# Patient Record
Sex: Female | Born: 1950 | Race: Black or African American | Hispanic: No | Marital: Married | Smoking: Former smoker
Health system: Southern US, Community
[De-identification: ages and names within clinical notes are randomized; demographics above are authoritative.]

## PROBLEM LIST (undated history)

## (undated) DIAGNOSIS — K219 Gastro-esophageal reflux disease without esophagitis: Secondary | ICD-10-CM

## (undated) DIAGNOSIS — G8929 Other chronic pain: Secondary | ICD-10-CM

## (undated) DIAGNOSIS — I10 Essential (primary) hypertension: Secondary | ICD-10-CM

## (undated) DIAGNOSIS — J449 Chronic obstructive pulmonary disease, unspecified: Secondary | ICD-10-CM

## (undated) DIAGNOSIS — E119 Type 2 diabetes mellitus without complications: Secondary | ICD-10-CM

## (undated) DIAGNOSIS — M545 Low back pain, unspecified: Secondary | ICD-10-CM

## (undated) DIAGNOSIS — I2510 Atherosclerotic heart disease of native coronary artery without angina pectoris: Secondary | ICD-10-CM

## (undated) DIAGNOSIS — E669 Obesity, unspecified: Secondary | ICD-10-CM

## (undated) DIAGNOSIS — M1990 Unspecified osteoarthritis, unspecified site: Secondary | ICD-10-CM

## (undated) DIAGNOSIS — Z860100 Personal history of colon polyps, unspecified: Secondary | ICD-10-CM

## (undated) DIAGNOSIS — G4730 Sleep apnea, unspecified: Secondary | ICD-10-CM

## (undated) DIAGNOSIS — E785 Hyperlipidemia, unspecified: Secondary | ICD-10-CM

## (undated) DIAGNOSIS — M797 Fibromyalgia: Secondary | ICD-10-CM

## (undated) DIAGNOSIS — Z800 Family history of malignant neoplasm of digestive organs: Secondary | ICD-10-CM

## (undated) HISTORY — PX: ARTHRODESIS: SHX136

## (undated) HISTORY — DX: Type 2 diabetes mellitus without complications: E11.9

## (undated) HISTORY — DX: Obesity, unspecified: E66.9

## (undated) HISTORY — DX: Unspecified osteoarthritis, unspecified site: M19.90

## (undated) HISTORY — DX: Atherosclerotic heart disease of native coronary artery without angina pectoris: I25.10

## (undated) HISTORY — DX: Chronic obstructive pulmonary disease, unspecified: J44.9

## (undated) HISTORY — DX: Family history of malignant neoplasm of digestive organs: Z80.0

## (undated) HISTORY — DX: Low back pain, unspecified: M54.50

## (undated) HISTORY — PX: HYSTEROSCOPY: SHX211

## (undated) HISTORY — DX: Fibromyalgia: M79.7

## (undated) HISTORY — DX: Gastro-esophageal reflux disease without esophagitis: K21.9

## (undated) HISTORY — PX: CHOLECYSTECTOMY, LAPAROSCOPIC: SHX56

## (undated) HISTORY — DX: Essential (primary) hypertension: I10

## (undated) HISTORY — DX: Personal history of colon polyps, unspecified: Z86.0100

## (undated) HISTORY — DX: Other chronic pain: G89.29

## (undated) HISTORY — PX: REPAIR NONUNION / MALUNION METATARSAL / TARSAL BONES: SUR1194

## (undated) HISTORY — PX: SHOULDER ARTHROSCOPY: SHX128

## (undated) HISTORY — PX: CORONARY ANGIOPLASTY: SHX604

## (undated) HISTORY — PX: CARDIAC CATHETERIZATION: SHX172

## (undated) HISTORY — PX: REPLACEMENT TOTAL KNEE: SUR1224

## (undated) HISTORY — DX: Hyperlipidemia, unspecified: E78.5

## (undated) HISTORY — DX: Personal history of colonic polyps: Z86.010

## (undated) HISTORY — PX: CHOLECYSTECTOMY: SHX55

## (undated) HISTORY — DX: Sleep apnea, unspecified: G47.30

---
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
## 2010-11-28 HISTORY — PX: HERNIA REPAIR: SHX51

## 2013-07-17 HISTORY — PX: ESOPHAGOGASTRODUODENOSCOPY: SHX1529

## 2014-09-17 HISTORY — PX: COLONOSCOPY: SHX174

## 2014-11-28 HISTORY — PX: REPLACEMENT TOTAL KNEE: SUR1224

## 2015-11-29 HISTORY — PX: FOOT SURGERY: SHX648

## 2016-02-01 DIAGNOSIS — H8110 Benign paroxysmal vertigo, unspecified ear: Secondary | ICD-10-CM | POA: Insufficient documentation

## 2016-02-01 DIAGNOSIS — I2510 Atherosclerotic heart disease of native coronary artery without angina pectoris: Secondary | ICD-10-CM

## 2016-02-01 DIAGNOSIS — E785 Hyperlipidemia, unspecified: Secondary | ICD-10-CM | POA: Insufficient documentation

## 2016-02-01 DIAGNOSIS — M159 Polyosteoarthritis, unspecified: Secondary | ICD-10-CM | POA: Insufficient documentation

## 2016-02-01 DIAGNOSIS — J449 Chronic obstructive pulmonary disease, unspecified: Secondary | ICD-10-CM | POA: Insufficient documentation

## 2016-02-01 DIAGNOSIS — K219 Gastro-esophageal reflux disease without esophagitis: Secondary | ICD-10-CM | POA: Insufficient documentation

## 2016-02-01 DIAGNOSIS — R7301 Impaired fasting glucose: Secondary | ICD-10-CM | POA: Insufficient documentation

## 2016-02-01 DIAGNOSIS — J309 Allergic rhinitis, unspecified: Secondary | ICD-10-CM | POA: Insufficient documentation

## 2016-02-01 DIAGNOSIS — I10 Essential (primary) hypertension: Secondary | ICD-10-CM | POA: Insufficient documentation

## 2016-02-01 HISTORY — DX: Gastro-esophageal reflux disease without esophagitis: K21.9

## 2016-02-01 HISTORY — DX: Hyperlipidemia, unspecified: E78.5

## 2016-02-01 HISTORY — DX: Morbid (severe) obesity due to excess calories: E66.01

## 2016-02-01 HISTORY — DX: Benign paroxysmal vertigo, unspecified ear: H81.10

## 2016-02-01 HISTORY — DX: Atherosclerotic heart disease of native coronary artery without angina pectoris: I25.10

## 2016-02-03 DIAGNOSIS — M542 Cervicalgia: Secondary | ICD-10-CM | POA: Insufficient documentation

## 2016-02-03 DIAGNOSIS — R202 Paresthesia of skin: Secondary | ICD-10-CM | POA: Insufficient documentation

## 2016-04-04 DIAGNOSIS — S92252K Displaced fracture of navicular [scaphoid] of left foot, subsequent encounter for fracture with nonunion: Secondary | ICD-10-CM | POA: Insufficient documentation

## 2016-04-14 DIAGNOSIS — M19079 Primary osteoarthritis, unspecified ankle and foot: Secondary | ICD-10-CM | POA: Insufficient documentation

## 2016-04-14 DIAGNOSIS — M19172 Post-traumatic osteoarthritis, left ankle and foot: Secondary | ICD-10-CM | POA: Insufficient documentation

## 2017-08-23 DIAGNOSIS — R748 Abnormal levels of other serum enzymes: Secondary | ICD-10-CM

## 2017-08-23 DIAGNOSIS — F1120 Opioid dependence, uncomplicated: Secondary | ICD-10-CM | POA: Insufficient documentation

## 2017-08-23 DIAGNOSIS — R600 Localized edema: Secondary | ICD-10-CM

## 2017-08-23 DIAGNOSIS — E7800 Pure hypercholesterolemia, unspecified: Secondary | ICD-10-CM

## 2017-08-23 DIAGNOSIS — E876 Hypokalemia: Secondary | ICD-10-CM

## 2017-08-23 DIAGNOSIS — E119 Type 2 diabetes mellitus without complications: Secondary | ICD-10-CM

## 2017-08-23 HISTORY — DX: Localized edema: R60.0

## 2017-08-23 HISTORY — DX: Type 2 diabetes mellitus without complications: E11.9

## 2017-08-23 HISTORY — DX: Hypokalemia: E87.6

## 2017-08-23 HISTORY — DX: Opioid dependence, uncomplicated: F11.20

## 2017-08-23 HISTORY — DX: Abnormal levels of other serum enzymes: R74.8

## 2017-08-23 HISTORY — DX: Pure hypercholesterolemia, unspecified: E78.00

## 2017-09-26 DIAGNOSIS — Z9889 Other specified postprocedural states: Secondary | ICD-10-CM | POA: Insufficient documentation

## 2018-04-10 DIAGNOSIS — J22 Unspecified acute lower respiratory infection: Secondary | ICD-10-CM | POA: Insufficient documentation

## 2018-05-02 ENCOUNTER — Ambulatory Visit: Payer: Medicare Other | Admitting: Cardiology

## 2018-05-15 ENCOUNTER — Ambulatory Visit: Payer: Medicare Other | Admitting: Cardiology

## 2018-05-24 ENCOUNTER — Encounter: Payer: Self-pay | Admitting: Cardiology

## 2018-05-24 ENCOUNTER — Ambulatory Visit (INDEPENDENT_AMBULATORY_CARE_PROVIDER_SITE_OTHER): Payer: Medicare Other | Admitting: Cardiology

## 2018-05-24 VITALS — BP 128/76 | HR 85 | Ht 66.0 in | Wt 289.4 lb

## 2018-05-24 DIAGNOSIS — R0683 Snoring: Secondary | ICD-10-CM

## 2018-05-24 DIAGNOSIS — E119 Type 2 diabetes mellitus without complications: Secondary | ICD-10-CM | POA: Diagnosis not present

## 2018-05-24 DIAGNOSIS — Z9189 Other specified personal risk factors, not elsewhere classified: Secondary | ICD-10-CM

## 2018-05-24 DIAGNOSIS — I2510 Atherosclerotic heart disease of native coronary artery without angina pectoris: Secondary | ICD-10-CM | POA: Diagnosis not present

## 2018-05-24 DIAGNOSIS — I10 Essential (primary) hypertension: Secondary | ICD-10-CM

## 2018-05-24 DIAGNOSIS — E7800 Pure hypercholesterolemia, unspecified: Secondary | ICD-10-CM | POA: Diagnosis not present

## 2018-05-24 NOTE — Progress Notes (Signed)
Cardiology Consultation:    Date:  05/24/2018   ID:  Bedelia Person, DOB 12/29/1950, MRN [id number]  PCP:  Leane Call, PA-C  Cardiologist:  Gypsy Balsam, MD   Referring MD: Leane Call, PA-C   No chief complaint on file. I would like to be followed  History of Present Illness:    Kristina Singleton is a 67 y.o. female who is being seen today for the evaluation of swelling of lower extremities at the request of Nodal, Joline Salt, PA-C.  Apparently I saw her many years ago he 2003 she had cardiac catheterization and stenting of unknown artery which was done in [place].  Since that time there were no major heart issues.  I did not have my old records when I saw her many years ago.  She does have hypertension dyslipidemia she is morbidly obese does have chronic pain syndrome that is treated by [facility].  She described to have swelling of lower extremities however swelling is typically worse in the morning rather than evening time.  Recently she had left foot surgery and think that likely is been swollen since then.  This is more than year ago.  Her ability to exercise is fair she says she can climb a flight of stairs however when she walks with her husband she need to slow down.  She does snore a lot.  And her husband who does have sleep apnea tells me that she does have sleep apnea.  Past Medical History:  Diagnosis Date  . Diabetes mellitus without complication (HCC)   . Hyperlipidemia     Past Surgical History:  Procedure Laterality Date  . ARTHRODESIS    . CARDIAC CATHETERIZATION    . CESAREAN SECTION    . CHOLECYSTECTOMY, LAPAROSCOPIC    . CORONARY ANGIOPLASTY    . 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 REPLACEMENT TOTAL KNEE    . SHOULDER ARTHROSCOPY      Current Medications: Current Meds  Medication Sig  . albuterol (PROAIR HFA) 108 (90 Base) MCG/ACT inhaler Inhale 2 puffs into the lungs every 6 (six) hours as needed.  .  cetirizine (ZYRTEC) 10 MG tablet Take 1 mg by mouth daily.  . cloNIDine (CATAPRES) 0.3 MG tablet Take 1 tablet by mouth daily.  . clopidogrel (PLAVIX) 75 MG tablet Take 1 tablet by mouth daily.  . cyclobenzaprine (FLEXERIL) 10 MG tablet Take 1 tablet by mouth 2 (two) times daily.   Marland Kitchen docusate sodium (COLACE) 100 MG capsule Take 1 capsule by mouth daily as needed.   Marland Kitchen esomeprazole (NEXIUM) 40 MG capsule Take 1 capsule by mouth 2 (two) times daily.  Marland Kitchen ezetimibe (ZETIA) 10 MG tablet Take 1 tablet by mouth daily.  . fluticasone (FLONASE) 50 MCG/ACT nasal spray Place 2 sprays into both nostrils 2 (two) times daily.  . furosemide (LASIX) 40 MG tablet Take 1 tablet by mouth as needed.   Marland Kitchen HYDROcodone Bitartrate ER (HYSINGLA ER) 20 MG T24A Take 1 tablet by mouth daily.  Marland Kitchen lactulose (CHRONULAC) 10 GM/15ML solution Take 30 mLs by mouth 2 (two) times daily.  Marland Kitchen losartan-hydrochlorothiazide (HYZAAR) 100-25 MG tablet Take 1 tablet by mouth daily.  . meclizine (ANTIVERT) 12.5 MG tablet Take 1-2 tablets by mouth every 6 (six) hours as needed.  . meloxicam (MOBIC) 7.5 MG tablet Take 1 tablet by mouth daily.  . nitroGLYCERIN (NITROSTAT) 0.4 MG SL tablet Place 1 tablet under the tongue as needed for chest  pain.  . olopatadine (PATANOL) 0.1 % ophthalmic solution Place 2 drops into both eyes as needed.  . potassium chloride SA (K-DUR,KLOR-CON) 20 MEQ tablet Take 2 tablets by mouth 2 (two) times daily.  . ranitidine (ZANTAC) 150 MG tablet Take 1 tablet by mouth 2 (two) times daily.  . simvastatin (ZOCOR) 40 MG tablet Take 1 tablet by mouth daily.  . sucralfate (CARAFATE) 1 g tablet Take 1 g by mouth 2 (two) times daily.     Allergies:   Azithromycin; Celecoxib; Morphine; Duloxetine; Erythromycin base; and Lisinopril   Social History   Socioeconomic History  . Marital status: Unknown    Spouse name: Not on file  . Number of children: Not on file  . Years of education: Not on file  . Highest education level:  Not on file  Occupational History  . Not on file  Social Needs  . Financial resource strain: Not on file  . Food insecurity:    Worry: Not on file    Inability: Not on file  . Transportation needs:    Medical: Not on file    Non-medical: Not on file  Tobacco Use  . Smoking status: Former Games developer  . Smokeless tobacco: Never Used  Substance and Sexual Activity  . Alcohol use: Yes  . Drug use: Never  . Sexual activity: Not on file  Lifestyle  . Physical activity:    Days per week: Not on file    Minutes per session: Not on file  . Stress: Not on file  Relationships  . Social connections:    Talks on phone: Not on file    Gets together: Not on file    Attends religious service: Not on file    Active member of club or organization: Not on file    Attends meetings of clubs or organizations: Not on file    Relationship status: Not on file  Other Topics Concern  . Not on file  Social History Narrative  . Not on file     Family History: The patient's family history includes Arthritis in her daughter; CAD in her mother; Colon cancer in her sister; Heart attack in her father; Heart disease in her mother; Hypertension in her father and mother. ROS:   Please see the history of present illness.    All 14 point review of systems negative except as described per history of present illness.  EKGs/Labs/Other Studies Reviewed:    The following studies were reviewed today:   EKG:  EKG is  ordered today.  The ekg ordered today demonstrates trans-rhythm normal P interval normal QS complex duration morphology nonspecific ST-T segment changes  Recent Labs: No results found for requested labs within last 8760 hours.  Recent Lipid Panel No results found for: CHOL, TRIG, HDL, CHOLHDL, VLDL, LDLCALC, LDLDIRECT  Physical Exam:    VS:  BP 128/76 (BP Location: Right Arm, Patient Position: Sitting, Cuff Size: Normal)   Pulse 85   Ht 5\' 6"  (1.676 m)   Wt 289 lb 6.4 oz (131.3 kg)   SpO2 98%    BMI 46.71 kg/m     Wt Readings from Last 3 Encounters:  05/24/18 289 lb 6.4 oz (131.3 kg)     GEN:  Well nourished, well developed in no acute distress HEENT: Normal NECK: No JVD; No carotid bruits LYMPHATICS: No lymphadenopathy CARDIAC: RRR, no murmurs, no rubs, no gallops RESPIRATORY:  Clear to auscultation without rales, wheezing or rhonchi  ABDOMEN: Soft, non-tender, non-distended MUSCULOSKELETAL:  No edema; No deformity  SKIN: Warm and dry NEUROLOGIC:  Alert and oriented x 3 PSYCHIATRIC:  Normal affect   ASSESSMENT:    1. Coronary artery disease involving native coronary artery of native heart without angina pectoris   2. Essential hypertension   3. Type 2 diabetes mellitus without complication, without long-term current use of insulin (HCC)   4. Hypercholesterolemia    PLAN:    In order of problems listed above:  1. Coronary artery disease with PTCA and stenting in 2003 appears to be asymptomatic a year ago she had a stress test done [place] which apparently was negative but I will try to retrieve results of the test.  She is already on aspirin and Plavix some questionable indications for Plavix in this clinical scenario but will wait for echocardiogram results of the stress test before decide about Plavix on top of that in my opinion she needs to have carotid ultrasounds she tells me that years ago she had a done and had some narrowing but no critical.  We will repeat the test and then decide about Plavix. 2. Essential hypertension blood pressure well controlled continue present medications. 3. Dyslipidemia: I do not see any recent cholesterol.  Will ask him to have fasting lipid profile done. She will have echocardiogram to assess left ventricular ejection fraction to address the issue of swelling of lower extremities however I suspect sleep apnea could be suspect of this.  Therefore we will schedule her to sleep study as well.  She does snore and is tired and  fatigue See her back in 1 month sooner if she has a problem   Medication Adjustments/Labs and Tests Ordered: Current medicines are reviewed at length with the patient today.  Concerns regarding medicines are outlined above.  No orders of the defined types were placed in this encounter.  No orders of the defined types were placed in this encounter.   Signed, Georgeanna Leaobert J. Redonna Wilbert, MD, [facility]. 05/24/2018 1:56 PM    [facility]

## 2018-05-24 NOTE — Patient Instructions (Signed)
Medication Instructions:  Your physician recommends that you continue on your current medications as directed. Please refer to the Current Medication list given to you today.   Labwork: None  Testing/Procedures: You had an EKG today.   Your physician has requested that you have an echocardiogram. Echocardiography is a painless test that uses sound waves to create images of your heart. It provides your doctor with information about the size and shape of your heart and how well your heart's chambers and valves are working. This procedure takes approximately one hour. There are no restrictions for this procedure.  Your physician has requested that you have a carotid duplex. This test is an ultrasound of the carotid arteries in your neck. It looks at blood flow through these arteries that supply the brain with blood. Allow one hour for this exam. There are no restrictions or special instructions.  You have been referred for a sleep study. You will be notified when this test has been scheduled.   Follow-Up: Your physician recommends that you schedule a follow-up appointment in: 1 month.  If you need a refill on your cardiac medications before your next appointment, please call your pharmacy.   Thank you for choosing [facility]! Mady Gemmaatherine Lockhart, RN [phone]

## 2018-05-25 ENCOUNTER — Telehealth: Payer: Self-pay | Admitting: *Deleted

## 2018-05-25 NOTE — Telephone Encounter (Signed)
[facility] no [facility]. Ok to schedule.

## 2018-05-25 NOTE — Telephone Encounter (Signed)
-----   Message from Crist Fatatherine P Lockhart, RN sent at 05/24/2018  2:16 PM EDT ----- Regarding: Please precert Please precert this patient for a sleep study in evaluation of sleep apnea due to snoring. Thanks!

## 2018-06-05 ENCOUNTER — Telehealth: Payer: Self-pay | Admitting: *Deleted

## 2018-06-05 NOTE — Telephone Encounter (Signed)
Patient informed that sleep study has been scheduled [facility] 07/03/18 at [facility]. Provided patient with address. Patient verbalized understanding. No further questions.

## 2018-06-15 ENCOUNTER — Ambulatory Visit (HOSPITAL_BASED_OUTPATIENT_CLINIC_OR_DEPARTMENT_OTHER)
Admission: RE | Admit: 2018-06-15 | Discharge: 2018-06-15 | Disposition: A | Discharge status: Home / Self Care | Payer: Medicare Other | Source: Ambulatory Visit | Attending: Cardiology | Admitting: Cardiology

## 2018-06-15 DIAGNOSIS — I6523 Occlusion and stenosis of bilateral carotid arteries: Secondary | ICD-10-CM | POA: Diagnosis not present

## 2018-06-15 DIAGNOSIS — E119 Type 2 diabetes mellitus without complications: Secondary | ICD-10-CM | POA: Diagnosis not present

## 2018-06-15 DIAGNOSIS — I2510 Atherosclerotic heart disease of native coronary artery without angina pectoris: Secondary | ICD-10-CM | POA: Diagnosis present

## 2018-06-15 DIAGNOSIS — I10 Essential (primary) hypertension: Secondary | ICD-10-CM | POA: Insufficient documentation

## 2018-06-15 DIAGNOSIS — E785 Hyperlipidemia, unspecified: Secondary | ICD-10-CM | POA: Insufficient documentation

## 2018-06-15 NOTE — Progress Notes (Signed)
VAS US CAROTID DUPLEX PERFORMED        Right ICA velocities in the are consistent with a 1-39% stenosis. Moderate focal plaque visualized at proxima ICA indicative of 1-39% stenosis  Moderate focal plaque visualized at left proxima ICA indicative of 1-39% stenosis    06/15/18  Bernadette HoitJoseph Mujallil

## 2018-06-15 NOTE — Progress Notes (Signed)
  Echocardiogram 2D Echocardiogram has been performed.  Elaine Roanhorse T Koralynn Greenspan 06/15/2018, 9:50 AM

## 2018-06-22 ENCOUNTER — Ambulatory Visit (INDEPENDENT_AMBULATORY_CARE_PROVIDER_SITE_OTHER): Payer: Medicare Other | Admitting: Cardiology

## 2018-06-22 ENCOUNTER — Encounter: Payer: Self-pay | Admitting: Cardiology

## 2018-06-22 VITALS — BP 150/88 | HR 78 | Resp 16 | Wt 296.0 lb

## 2018-06-22 DIAGNOSIS — E785 Hyperlipidemia, unspecified: Secondary | ICD-10-CM

## 2018-06-22 DIAGNOSIS — E119 Type 2 diabetes mellitus without complications: Secondary | ICD-10-CM

## 2018-06-22 DIAGNOSIS — I10 Essential (primary) hypertension: Secondary | ICD-10-CM | POA: Diagnosis not present

## 2018-06-22 DIAGNOSIS — I2510 Atherosclerotic heart disease of native coronary artery without angina pectoris: Secondary | ICD-10-CM | POA: Diagnosis not present

## 2018-06-22 MED ORDER — ATORVASTATIN CALCIUM 40 MG PO TABS: 40.0000 mg | ORAL_TABLET | Freq: Every day | ORAL | 2 refills | Status: DC

## 2018-06-22 NOTE — Progress Notes (Signed)
Cardiology Office Note:    Date:  06/22/2018   ID:  Kristina Singleton, DOB Jan 06, 1951, MRN [id number]  PCP:  Leane CallNodal, Jr Reinaldo, PA-C  Cardiologist:  Gypsy Balsamobert Marke Goodwyn, MD    Referring MD: Leane CallNodal, Jr Reinaldo, PA-C   No chief complaint on file. Doing well  History of Present Illness:    Kristina Singleton is a 67 y.o. female coronary artery disease status post stenting done many years ago.  Hypertension.  Diabetes.  Dyslipidemia comes to our for follow-up overall seems to be doing well denies have any chest pain tightness squeezing pressure burning chest she does have some fatigue and tiredness.  She did have echocardiogram which showed preserved left ventricular ejection fraction.  Also carotid ultrasound did not show any critical stenosis.  Past Medical History:  Diagnosis Date  . Diabetes mellitus without complication (HCC)    doesn't have  . Hyperlipidemia     Past Surgical History:  Procedure Laterality Date  . ARTHRODESIS    . CARDIAC CATHETERIZATION    . CESAREAN SECTION    . CHOLECYSTECTOMY, LAPAROSCOPIC    . CORONARY ANGIOPLASTY    . 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 REPLACEMENT TOTAL KNEE    . SHOULDER ARTHROSCOPY      Current Medications: Current Meds  Medication Sig  . albuterol (PROAIR HFA) 108 (90 Base) MCG/ACT inhaler Inhale 2 puffs into the lungs every 6 (six) hours as needed.  . cetirizine (ZYRTEC) 10 MG tablet Take 1 mg by mouth daily.  . cloNIDine (CATAPRES) 0.3 MG tablet Take 1 tablet by mouth daily.  . clopidogrel (PLAVIX) 75 MG tablet Take 1 tablet by mouth daily.  . cyclobenzaprine (FLEXERIL) 10 MG tablet Take 1 tablet by mouth 2 (two) times daily.   Marland Kitchen. docusate sodium (COLACE) 100 MG capsule Take 1 capsule by mouth daily as needed.   Marland Kitchen. esomeprazole (NEXIUM) 40 MG capsule Take 1 capsule by mouth 2 (two) times daily.  Marland Kitchen. ezetimibe (ZETIA) 10 MG tablet Take 1 tablet by mouth daily.  . fluticasone (FLONASE) 50 MCG/ACT nasal spray Place 2  sprays into both nostrils 2 (two) times daily.  . furosemide (LASIX) 40 MG tablet Take 1 tablet by mouth as needed.   Marland Kitchen. losartan-hydrochlorothiazide (HYZAAR) 100-25 MG tablet Take 1 tablet by mouth daily.  . meclizine (ANTIVERT) 12.5 MG tablet Take 1-2 tablets by mouth every 6 (six) hours as needed.  . meloxicam (MOBIC) 7.5 MG tablet Take 1 tablet by mouth daily.  . nitroGLYCERIN (NITROSTAT) 0.4 MG SL tablet Place 1 tablet under the tongue as needed for chest pain.  Marland Kitchen. olopatadine (PATANOL) 0.1 % ophthalmic solution Place 2 drops into both eyes as needed.  . potassium chloride SA (K-DUR,KLOR-CON) 20 MEQ tablet Take 2 tablets by mouth 2 (two) times daily.  . ranitidine (ZANTAC) 150 MG tablet Take 1 tablet by mouth 2 (two) times daily.  . simvastatin (ZOCOR) 40 MG tablet Take 1 tablet by mouth daily.  . sucralfate (CARAFATE) 1 g tablet Take 1 g by mouth 2 (two) times daily.     Allergies:   Azithromycin; Celecoxib; Morphine; Duloxetine; Erythromycin base; and Lisinopril   Social History   Socioeconomic History  . Marital status: Unknown    Spouse name: Not on file  . Number of children: Not on file  . Years of education: Not on file  . Highest education level: Not on file  Occupational History  . Not on file  Social Needs  .  Financial resource strain: Not on file  . Food insecurity:    Worry: Not on file    Inability: Not on file  . Transportation needs:    Medical: Not on file    Non-medical: Not on file  Tobacco Use  . Smoking status: Former Games developer  . Smokeless tobacco: Never Used  Substance and Sexual Activity  . Alcohol use: Yes    Alcohol/week: 1.2 oz    Types: 2 Glasses of wine per week  . Drug use: Never  . Sexual activity: Not on file  Lifestyle  . Physical activity:    Days per week: Not on file    Minutes per session: Not on file  . Stress: Not on file  Relationships  . Social connections:    Talks on phone: Not on file    Gets together: Not on file     Attends religious service: Not on file    Active member of club or organization: Not on file    Attends meetings of clubs or organizations: Not on file    Relationship status: Not on file  Other Topics Concern  . Not on file  Social History Narrative  . Not on file     Family History: The patient's family history includes Arthritis in her daughter; CAD in her mother; Colon cancer in her sister; Heart attack in her father; Heart disease in her mother; Hypertension in her father and mother. ROS:   Please see the history of present illness.    All 14 point review of systems negative except as described per history of present illness  EKGs/Labs/Other Studies Reviewed:      Recent Labs: No results found for requested labs within last 8760 hours.  Recent Lipid Panel No results found for: CHOL, TRIG, HDL, CHOLHDL, VLDL, LDLCALC, LDLDIRECT  Physical Exam:    VS:  BP (!) 150/88   Pulse 78   Resp 16   Wt 296 lb (134.3 kg)   SpO2 97%   BMI 47.78 kg/m     Wt Readings from Last 3 Encounters:  06/22/18 296 lb (134.3 kg)  05/24/18 289 lb 6.4 oz (131.3 kg)     GEN:  Well nourished, well developed in no acute distress HEENT: Normal NECK: No JVD; No carotid bruits LYMPHATICS: No lymphadenopathy CARDIAC: RRR, no murmurs, no rubs, no gallops RESPIRATORY:  Clear to auscultation without rales, wheezing or rhonchi  ABDOMEN: Soft, non-tender, non-distended MUSCULOSKELETAL:  No edema; No deformity  SKIN: Warm and dry LOWER EXTREMITIES: no swelling NEUROLOGIC:  Alert and oriented x 3 PSYCHIATRIC:  Normal affect   ASSESSMENT:    1. Coronary artery disease involving native coronary artery of native heart without angina pectoris   2. Essential hypertension   3. Type 2 diabetes mellitus without complication, without long-term current use of insulin (HCC)   4. Dyslipidemia   5. Morbid obesity (HCC)    PLAN:    In order of problems listed above:  1. Coronary artery disease no new  symptoms asymptomatic we will continue present management she is not on aspirin she is on Plavix because previously she had some issue with her stomach while taking aspirin we will continue 2. Essential hypertension blood pressure well controlled however today she will be elevated she check it at home usually good. 3. Type 2 diabetes doing well from that point we will continue present management. 4. Dyslipidemia I will ask her to switch to high intensity statin ago with 40 mg of Lipitor  and then we will recheck her fasting lipid profile as well as AST ALT within next few weeks. 5. Obesity obviously a problem she understands that she try to work on losing weight.  See her back in my office 3 months or sooner if she get a problem   Medication Adjustments/Labs and Tests Ordered: Current medicines are reviewed at length with the patient today.  Concerns regarding medicines are outlined above.  No orders of the defined types were placed in this encounter.  Medication changes: No orders of the defined types were placed in this encounter.   Signed, Georgeanna Lea, MD, [facility] 06/22/2018 3:18 PM    [facility]

## 2018-06-22 NOTE — Patient Instructions (Signed)
Medication Instructions:  Your physician has recommended you make the following change in your medication:  STOP: Simvastatin(Zocor) 40 mg daily.  START: Atorvastatin (Lipitor)  40 mg daily.    Labwork:  Your physician recommends that you return for lab work In 1 month: Lipid, AST, and ALT.  Testing/Procedures: None.  Follow-Up: Your physician wants you to follow-up in: 3 months. You will receive a reminder letter in the mail two months in advance. If you don't receive a letter, please call our office to schedule the follow-up appointment.   Any Other Special Instructions Will Be Listed Below (If Applicable).  Atorvastatin tablets What is this medicine? ATORVASTATIN (a TORE va sta tin) is known as a HMG-CoA reductase inhibitor or 'statin'. It lowers the level of cholesterol and triglycerides in the blood. This drug may also reduce the risk of heart attack, stroke, or other health problems in patients with risk factors for heart disease. Diet and lifestyle changes are often used with this drug. This medicine may be used for other purposes; ask your health care provider or pharmacist if you have questions. COMMON BRAND NAME(S): Lipitor What should I tell my health care provider before I take this medicine? They need to know if you have any of these conditions: -frequently drink alcoholic beverages -history of stroke, TIA -kidney disease -liver disease -muscle aches or weakness -other medical condition -an unusual or allergic reaction to atorvastatin, other medicines, foods, dyes, or preservatives -pregnant or trying to get pregnant -breast-feeding How should I use this medicine? Take this medicine by mouth with a glass of water. Follow the directions on the prescription label. You can take this medicine with or without food. Take your doses at regular intervals. Do not take your medicine more often than directed. Talk to your pediatrician regarding the use of this medicine in  children. While this drug may be prescribed for children as young as 10 years old for selected conditions, precautions do apply. Overdosage: If you think you have taken too much of this medicine contact a poison control center or emergency room at once. NOTE: This medicine is only for you. Do not share this medicine with others. What if I miss a dose? If you miss a dose, take it as soon as you can. If it is almost time for your next dose, take only that dose. Do not take double or extra doses. What may interact with this medicine? Do not take this medicine with any of the following medications: -red yeast rice -telaprevir -telithromycin -voriconazole This medicine may also interact with the following medications: -alcohol -antiviral medicines for HIV or AIDS -boceprevir -certain antibiotics like clarithromycin, erythromycin, troleandomycin -certain medicines for cholesterol like fenofibrate or gemfibrozil -cimetidine -clarithromycin -colchicine -cyclosporine -digoxin -female hormones, like estrogens or progestins and birth control pills -grapefruit juice -medicines for fungal infections like fluconazole, itraconazole, ketoconazole -niacin -rifampin -spironolactone This list may not describe all possible interactions. Give your health care provider a list of all the medicines, herbs, non-prescription drugs, or dietary supplements you use. Also tell them if you smoke, drink alcohol, or use illegal drugs. Some items may interact with your medicine. What should I watch for while using this medicine? Visit your doctor or health care professional for regular check-ups. You may need regular tests to make sure your liver is working properly. Tell your doctor or health care professional right away if you get any unexplained muscle pain, tenderness, or weakness, especially if you also have a fever and tiredness. Your doctor  or health care professional may tell you to stop taking this medicine if  you develop muscle problems. If your muscle problems do not go away after stopping this medicine, contact your health care professional. This drug is only part of a total heart-health program. Your doctor or a dietician can suggest a low-cholesterol and low-fat diet to help. Avoid alcohol and smoking, and keep a proper exercise schedule. Do not use this drug if you are pregnant or breast-feeding. Serious side effects to an unborn child or to an infant are possible. Talk to your doctor or pharmacist for more information. This medicine may affect blood sugar levels. If you have diabetes, check with your doctor or health care professional before you change your diet or the dose of your diabetic medicine. If you are going to have surgery tell your health care professional that you are taking this drug. What side effects may I notice from receiving this medicine? Side effects that you should report to your doctor or health care professional as soon as possible: -allergic reactions like skin rash, itching or hives, swelling of the face, lips, or tongue -dark urine -fever -joint pain -muscle cramps, pain -redness, blistering, peeling or loosening of the skin, including inside the mouth -trouble passing urine or change in the amount of urine -unusually weak or tired -yellowing of eyes or skin Side effects that usually do not require medical attention (report to your doctor or health care professional if they continue or are bothersome): -constipation -heartburn -stomach gas, pain, upset This list may not describe all possible side effects. Call your doctor for medical advice about side effects. You may report side effects to FDA at 1-800-FDA-1088. Where should I keep my medicine? Keep out of the reach of children. Store at room temperature between 20 to 25 degrees C (68 to 77 degrees F). Throw away any unused medicine after the expiration date. NOTE: This sheet is a summary. It may not cover all  possible information. If you have questions about this medicine, talk to your doctor, pharmacist, or health care provider.  2018 Elsevier/Gold Standard (2011-10-04 16:10:[id number]:24)     If you need a refill on your cardiac medications before your next appointment, please call your pharmacy.

## 2018-07-03 ENCOUNTER — Ambulatory Visit (HOSPITAL_BASED_OUTPATIENT_CLINIC_OR_DEPARTMENT_OTHER): Payer: Medicare Other | Attending: Cardiology | Admitting: Cardiovascular Disease

## 2018-07-03 VITALS — Ht 66.0 in | Wt 295.0 lb

## 2018-07-03 DIAGNOSIS — Z9189 Other specified personal risk factors, not elsewhere classified: Secondary | ICD-10-CM | POA: Insufficient documentation

## 2018-07-03 DIAGNOSIS — R0683 Snoring: Secondary | ICD-10-CM | POA: Diagnosis present

## 2018-07-03 DIAGNOSIS — G4733 Obstructive sleep apnea (adult) (pediatric): Secondary | ICD-10-CM

## 2018-07-11 ENCOUNTER — Encounter (HOSPITAL_BASED_OUTPATIENT_CLINIC_OR_DEPARTMENT_OTHER): Payer: Self-pay | Admitting: Cardiovascular Disease

## 2018-07-11 NOTE — Procedures (Signed)
Patient Name: Kristina Singleton, Marshell Study Date: 07/03/2018 Gender: Female D.O.B: February 24, 1951 Age (years): 67 Referring Provider: Georgeanna Leaobert J Krasowski Height (inches): 65 Interpreting Physician: Nicki Guadalajarahomas Cerria Randhawa MD, ABSM Weight (lbs): 295 RPSGT: Ulyess MortSpruill, Vicki BMI: 49 MRN: [id number] Neck Size: 18.00  CLINICAL INFORMATION Sleep Study Type: NPSG  Indication for sleep study: Fatigue, Hypertension, Obesity, Snoring  Epworth Sleepiness Score: 3  SLEEP STUDY TECHNIQUE As per the AASM Manual for the Scoring of Sleep and Associated Events v2.3 (April 2016) with a hypopnea requiring 4% desaturations.  The channels recorded and monitored were frontal, central and occipital EEG, electrooculogram (EOG), submentalis EMG (chin), nasal and oral airflow, thoracic and abdominal wall motion, anterior tibialis EMG, snore microphone, electrocardiogram, and pulse oximetry.  MEDICATIONS albuterol (PROAIR HFA) 108 (90 Base) MCG/ACT inhaler  atorvastatin (LIPITOR) 40 MG tablet  cetirizine (ZYRTEC) 10 MG tablet  cloNIDine (CATAPRES) 0.3 MG tablet  clopidogrel (PLAVIX) 75 MG tablet  cyclobenzaprine (FLEXERIL) 10 MG tablet  docusate sodium (COLACE) 100 MG capsule  esomeprazole (NEXIUM) 40 MG capsule  ezetimibe (ZETIA) 10 MG tablet  fluticasone (FLONASE) 50 MCG/ACT nasal spray  furosemide (LASIX) 40 MG tablet  levETIRAcetam (KEPPRA) 250 MG tablet (Expired)  losartan-hydrochlorothiazide (HYZAAR) 100-25 MG tablet  meclizine (ANTIVERT) 12.5 MG tablet  meloxicam (MOBIC) 7.5 MG tablet  nitroGLYCERIN (NITROSTAT) 0.4 MG SL tablet  olopatadine (PATANOL) 0.1 % ophthalmic solution  potassium chloride SA (K-DUR,KLOR-CON) 20 MEQ tablet  ranitidine (ZANTAC) 150 MG tablet  sucralfate (CARAFATE) 1 g tablet   Medications self-administered by patient taken the night of the study : LIPITOR, FLEXERIL, NEXIUM, POTASSIUM CHLORIDE, ZANTAC  SLEEP ARCHITECTURE The study was initiated at 10:28:52 PM and ended at 4:58:09  AM.  Sleep onset time was 64.4 minutes and the sleep efficiency was 71.4%%. The total sleep time was 277.9 minutes.  Stage REM latency was 115.5 minutes.  The patient spent 17.1%% of the night in stage N1 sleep, 76.6%% in stage N2 sleep, 0.0%% in stage N3 and 6.3% in REM.  Alpha intrusion was absent.  Supine sleep was 23.39%.  RESPIRATORY PARAMETERS The overall apnea/hypopnea index (AHI) was 8.9 per hour. The repiratory disturbance index (RDI) was 19.0 There were 7 total apneas, including 5 obstructive, 1 central and 1 mixed apneas. There were 34 hypopneas and 47 RERAs.  The AHI during Stage REM sleep was 68.6 per hour.  AHI while supine was 12.9 per hour.  The mean oxygen saturation was 96.7%. The minimum SpO2 during sleep was 83.0%.  Loud snoring was noted during this study.  CARDIAC DATA The 2 lead EKG demonstrated sinus rhythm. The mean heart rate was 61.9 beats per minute. Other EKG findings include: None.  LEG MOVEMENT DATA The total PLMS were 0 with a resulting PLMS index of 0.0. Associated arousal with leg movement index was 0.0 .  IMPRESSIONS - Mild obstructive sleep apnea overtall (AHI 8.9/h); however, events were very severe during REM sleep. - No significant central sleep apnea occurred during this study (CAI = 0.2/h). - Moderate oxygen desaturation to a nadir of 83.0%. - The patient snored with loud snoring volume. - No cardiac abnormalities were noted during this study. - Clinically significant periodic limb movements did not occur during sleep. No significant associated arousals.  DIAGNOSIS - Obstructive Sleep Apnea (327.23 [G47.33 ICD-10]) - Nocturnal Hypoxemia (327.26 [G47.36 ICD-10])  RECOMMENDATIONS - Therapeutic CPAP titration to determine optimal pressure required to alleviate sleep disordered breathing. - Efforts should be made to optimize nasal and oropharyngeal patency. - Avoid alcohol,  sedatives and other CNS depressants that may worsen sleep apnea  and disrupt normal sleep architecture. - Sleep hygiene should be reviewed to assess factors that may improve sleep quality. - Weight management (BMI 49) and regular exercise should be initiated or continued if appropriate.  [Electronically signed] 07/11/2018 05:46 PM  Nicki Guadalajarahomas Keefe Zawistowski MD, [place] [facility] Center[facility]ACC, ABSM Diplomate, American Board of Sleep Medicine   NPI: [id number] [facility] PH: [phone]   FX: [phone] ACCREDITED BY THE AMERICAN ACADEMY OF SLEEP MEDICINE

## 2018-07-12 ENCOUNTER — Other Ambulatory Visit: Payer: Self-pay | Admitting: Cardiovascular Disease

## 2018-07-12 ENCOUNTER — Telehealth: Payer: Self-pay | Admitting: *Deleted

## 2018-07-12 DIAGNOSIS — I10 Essential (primary) hypertension: Secondary | ICD-10-CM

## 2018-07-12 DIAGNOSIS — G4733 Obstructive sleep apnea (adult) (pediatric): Secondary | ICD-10-CM

## 2018-07-12 NOTE — Telephone Encounter (Signed)
Patient notified of sleep study results and recommendations. She has agreed to having CPAP titration study. Appointment scheduled for September 17th. Patient aware of appointment.

## 2018-07-12 NOTE — Telephone Encounter (Signed)
-----   Message from Lennette Biharihomas A Kelly, MD sent at 07/11/2018  5:50 PM EDT ----- Burna MortimerWanda please notify pt of results and schedule for CPAP titration study

## 2018-07-12 NOTE — Progress Notes (Signed)
07/12/18 patient notified. 

## 2018-08-14 ENCOUNTER — Ambulatory Visit (HOSPITAL_BASED_OUTPATIENT_CLINIC_OR_DEPARTMENT_OTHER): Payer: Medicare Other | Attending: Cardiovascular Disease | Admitting: Cardiovascular Disease

## 2018-08-14 VITALS — Ht 65.0 in | Wt 296.0 lb

## 2018-08-14 DIAGNOSIS — Z791 Long term (current) use of non-steroidal anti-inflammatories (NSAID): Secondary | ICD-10-CM | POA: Diagnosis not present

## 2018-08-14 DIAGNOSIS — G4733 Obstructive sleep apnea (adult) (pediatric): Secondary | ICD-10-CM | POA: Diagnosis present

## 2018-08-14 DIAGNOSIS — Z7902 Long term (current) use of antithrombotics/antiplatelets: Secondary | ICD-10-CM | POA: Diagnosis not present

## 2018-08-14 DIAGNOSIS — I10 Essential (primary) hypertension: Secondary | ICD-10-CM | POA: Diagnosis not present

## 2018-08-14 DIAGNOSIS — Z79899 Other long term (current) drug therapy: Secondary | ICD-10-CM | POA: Diagnosis not present

## 2018-08-14 DIAGNOSIS — Z7951 Long term (current) use of inhaled steroids: Secondary | ICD-10-CM | POA: Insufficient documentation

## 2018-08-16 DIAGNOSIS — M79671 Pain in right foot: Secondary | ICD-10-CM

## 2018-08-16 DIAGNOSIS — M25571 Pain in right ankle and joints of right foot: Secondary | ICD-10-CM | POA: Insufficient documentation

## 2018-08-16 HISTORY — DX: Pain in right foot: M79.671

## 2018-08-16 HISTORY — DX: Pain in right ankle and joints of right foot: M25.571

## 2018-08-26 ENCOUNTER — Encounter (HOSPITAL_BASED_OUTPATIENT_CLINIC_OR_DEPARTMENT_OTHER): Payer: Self-pay | Admitting: Cardiovascular Disease

## 2018-08-26 NOTE — Procedures (Signed)
Patient Name: Kristina Singleton, Kristina Singleton Date: 08/14/2018 Gender: Female D.O.B: [birth year] Age (years): 67 Referring Provider: Georgeanna Lea Height (inches): 65 Interpreting Physician: Nicki Guadalajara MD, ABSM Weight (lbs): 295 RPSGT: Lise Auer BMI: 49 MRN: [id number] Neck Size: 18.00  CLINICAL INFORMATION The patient is referred for a CPAP titration to treat sleep apnea.  Date of NPSG:  07/03/2018: AHI 8.9/h; RDI 19.0/h: REM sleep AHI 68.6/h: O2 desaturation 83%.  SLEEP STUDY TECHNIQUE As per the AASM Manual for the Scoring of Sleep and Associated Events v2.3 (April 2016) with a hypopnea requiring 4% desaturations.  The channels recorded and monitored were frontal, central and occipital EEG, electrooculogram (EOG), submentalis EMG (chin), nasal and oral airflow, thoracic and abdominal wall motion, anterior tibialis EMG, snore microphone, electrocardiogram, and pulse oximetry. Continuous positive airway pressure (CPAP) was initiated at the beginning of the study and titrated to treat sleep-disordered breathing.  MEDICATIONS     albuterol (PROAIR HFA) 108 (90 Base) MCG/ACT inhaler         atorvastatin (LIPITOR) 40 MG tablet         cetirizine (ZYRTEC) 10 MG tablet         cloNIDine (CATAPRES) 0.3 MG tablet         clopidogrel (PLAVIX) 75 MG tablet         cyclobenzaprine (FLEXERIL) 10 MG tablet         docusate sodium (COLACE) 100 MG capsule         esomeprazole (NEXIUM) 40 MG capsule         ezetimibe (ZETIA) 10 MG tablet         fluticasone (FLONASE) 50 MCG/ACT nasal spray         furosemide (LASIX) 40 MG tablet         levETIRAcetam (KEPPRA) 250 MG tablet (Expired)         losartan-hydrochlorothiazide (HYZAAR) 100-25 MG tablet         meclizine (ANTIVERT) 12.5 MG tablet         meloxicam (MOBIC) 7.5 MG tablet         nitroGLYCERIN (NITROSTAT) 0.4 MG SL tablet         olopatadine (PATANOL) 0.1 % ophthalmic solution         potassium chloride SA  (K-DUR,KLOR-CON) 20 MEQ tablet         ranitidine (ZANTAC) 150 MG tablet         sucralfate (CARAFATE) 1 g tablet      Medications self-administered by patient taken the night of the study : LIPITOR, FLEXERIL, NEXIUM, POTASSIUM CHLORIDE, ZANTAC  TECHNICIAN COMMENTS Comments added by technician: Patient had difficulty initiating sleep. Comments added by scorer: N/A  RESPIRATORY PARAMETERS Optimal PAP Pressure (cm): 16 AHI at Optimal Pressure (/hr): 4.5 Overall Minimal O2 (%): 85.0 Supine % at Optimal Pressure (%): 100 Minimal O2 at Optimal Pressure (%): 92.0   SLEEP ARCHITECTURE The study was initiated at 10:00:35 PM and ended at 5:00:05 AM.  Sleep onset time was 86.2 minutes and the sleep efficiency was 70.2%%. The total sleep time was 294.5 minutes.  The patient spent 9.3%% of the night in stage N1 sleep, 69.8%% in stage N2 sleep, 0.0%% in stage N3 and 20.9% in REM.Stage REM latency was 90.5 minutes  Wake after sleep onset was 38.8. Alpha intrusion was absent. Supine sleep was 100.00%.  CARDIAC DATA The 2 lead EKG demonstrated sinus rhythm. The mean heart rate was 76.5 beats per minute. Other EKG findings include:  None.  LEG MOVEMENT DATA The total Periodic Limb Movements of Sleep (PLMS) were 0. The PLMS index was 0.0. A PLMS index of <15 is considered normal in adults.  IMPRESSIONS - CPAP was initiated at 5 cm and was titrated to 16 cm of water. AHI at 15 cm was 1.6; AHI at 16 cm was 4.5 with 2 central events. The optimal PAP pressure was 15 cm of water. - Central sleep apnea was not noted during this titration (CAI = 0.4/h). - Moderate oxygen desaturations to a nadir of 85%. - The patient snored with soft snoring volume during this titration study. - No cardiac abnormalities were observed during this study. - Clinically significant periodic limb movements were not noted during this study. Arousals associated with PLMs were rare.  DIAGNOSIS - Obstructive Sleep Apnea (327.23  [G47.33 ICD-10])  RECOMMENDATIONS - Recommend an initial trial of CPAP therapy at 15 cm H2O with heated humidification.  A Medium size Philips Respironics Full Face Mask Dreamwear mask was used for the titration.  - Effort should be made to optimize nasal and oropharyngeal patency. - Avoid alcohol, sedatives and other CNS depressants that may worsen sleep apnea and disrupt normal sleep architecture. - Sleep hygiene should be reviewed to assess factors that may improve sleep quality. - Weight management and regular exercise should be initiated or continued. - Recommend a download in 30 days and sleep clinic evaluation after 4 weeks of therapy.   [Electronically signed] 08/26/2018 08:47 PM  Nicki Guadalajara MD, [facility], ABSM Diplomate, American Board of Sleep Medicine   NPI: [id number] [place] [facility] PH: [phone]   FX: [phone] ACCREDITED BY THE AMERICAN ACADEMY OF SLEEP MEDICINE

## 2018-08-29 ENCOUNTER — Telehealth: Payer: Self-pay | Admitting: *Deleted

## 2018-08-29 DIAGNOSIS — M19071 Primary osteoarthritis, right ankle and foot: Secondary | ICD-10-CM

## 2018-08-29 HISTORY — DX: Primary osteoarthritis, right ankle and foot: M19.071

## 2018-08-29 NOTE — Telephone Encounter (Signed)
Patient notified CPAP titration has been completed. Referral has been sent to [facility] for CPAP set up.

## 2018-08-29 NOTE — Telephone Encounter (Signed)
-----   Message from Lennette Bihari, MD sent at 08/26/2018  8:51 PM EDT ----- Burna Mortimer please notify pt of results and set up CPAP with DME

## 2018-09-21 ENCOUNTER — Other Ambulatory Visit: Payer: Self-pay | Admitting: Cardiology

## 2018-10-23 DIAGNOSIS — M84375A Stress fracture, left foot, initial encounter for fracture: Secondary | ICD-10-CM | POA: Insufficient documentation

## 2018-10-23 HISTORY — DX: Stress fracture, left foot, initial encounter for fracture: M84.375A

## 2018-11-05 ENCOUNTER — Other Ambulatory Visit: Payer: Self-pay | Admitting: Cardiology

## 2018-11-13 ENCOUNTER — Other Ambulatory Visit: Payer: Self-pay | Admitting: Physician Assistant

## 2018-11-13 DIAGNOSIS — Z1231 Encounter for screening mammogram for malignant neoplasm of breast: Secondary | ICD-10-CM

## 2018-11-15 DIAGNOSIS — Q6652 Congenital pes planus, left foot: Secondary | ICD-10-CM | POA: Insufficient documentation

## 2018-11-15 HISTORY — DX: Congenital pes planus, left foot: Q66.52

## 2018-12-21 ENCOUNTER — Ambulatory Visit: Payer: Medicare Other

## 2019-01-07 DIAGNOSIS — M79661 Pain in right lower leg: Secondary | ICD-10-CM | POA: Insufficient documentation

## 2019-01-07 HISTORY — DX: Pain in right lower leg: M79.661

## 2019-01-23 ENCOUNTER — Ambulatory Visit
Admission: RE | Admit: 2019-01-23 | Discharge: 2019-01-23 | Disposition: A | Discharge status: Home / Self Care | Payer: Medicare Other | Source: Ambulatory Visit | Attending: Physician Assistant | Admitting: Physician Assistant

## 2019-01-23 DIAGNOSIS — Z1231 Encounter for screening mammogram for malignant neoplasm of breast: Secondary | ICD-10-CM

## 2019-01-25 ENCOUNTER — Encounter: Payer: Self-pay | Admitting: Radiology

## 2019-01-25 ENCOUNTER — Ambulatory Visit
Admission: RE | Admit: 2019-01-25 | Discharge: 2019-01-25 | Disposition: A | Discharge status: Home / Self Care | Payer: Medicare Other | Source: Ambulatory Visit | Attending: Physician Assistant | Admitting: Physician Assistant

## 2019-02-07 ENCOUNTER — Other Ambulatory Visit: Payer: Self-pay | Admitting: Cardiology

## 2019-02-07 LAB — LIPID PANEL
Chol/HDL Ratio: 2.4 ratio (ref 0.0–4.4)
Cholesterol, Total: 165 mg/dL (ref 100–199)
HDL: 70 mg/dL (ref >39)
LDL CALC: 68 mg/dL (ref 0–99)
Triglycerides: 134 mg/dL (ref 0–149)
VLDL CHOLESTEROL CAL: 27 mg/dL (ref 5–40)

## 2019-02-07 LAB — ALT: ALT: 22 IU/L (ref 0–32)

## 2019-02-07 LAB — AST: AST: 24 IU/L (ref 0–40)

## 2019-08-07 ENCOUNTER — Other Ambulatory Visit: Payer: Self-pay | Admitting: Cardiology

## 2019-08-30 DIAGNOSIS — E119 Type 2 diabetes mellitus without complications: Secondary | ICD-10-CM | POA: Diagnosis not present

## 2019-08-30 DIAGNOSIS — N183 Chronic kidney disease, stage 3 unspecified: Secondary | ICD-10-CM | POA: Diagnosis not present

## 2019-08-30 DIAGNOSIS — I129 Hypertensive chronic kidney disease with stage 1 through stage 4 chronic kidney disease, or unspecified chronic kidney disease: Secondary | ICD-10-CM | POA: Diagnosis not present

## 2019-09-20 ENCOUNTER — Ambulatory Visit: Payer: Medicare Other | Admitting: Cardiology

## 2019-09-24 ENCOUNTER — Encounter: Payer: Self-pay | Admitting: Family

## 2019-09-24 ENCOUNTER — Other Ambulatory Visit: Payer: Self-pay

## 2019-09-24 ENCOUNTER — Ambulatory Visit (INDEPENDENT_AMBULATORY_CARE_PROVIDER_SITE_OTHER): Payer: Medicare Other | Admitting: Family

## 2019-09-24 VITALS — BP 104/70 | HR 87 | Temp 97.0°F | Ht 66.0 in | Wt 307.0 lb

## 2019-09-24 DIAGNOSIS — E785 Hyperlipidemia, unspecified: Secondary | ICD-10-CM | POA: Diagnosis not present

## 2019-09-24 DIAGNOSIS — R519 Headache, unspecified: Secondary | ICD-10-CM

## 2019-09-24 DIAGNOSIS — E119 Type 2 diabetes mellitus without complications: Secondary | ICD-10-CM

## 2019-09-24 DIAGNOSIS — I10 Essential (primary) hypertension: Secondary | ICD-10-CM | POA: Diagnosis not present

## 2019-09-24 DIAGNOSIS — R0683 Snoring: Secondary | ICD-10-CM

## 2019-09-24 DIAGNOSIS — G4733 Obstructive sleep apnea (adult) (pediatric): Secondary | ICD-10-CM | POA: Diagnosis not present

## 2019-09-24 DIAGNOSIS — Z79899 Other long term (current) drug therapy: Secondary | ICD-10-CM

## 2019-09-24 DIAGNOSIS — R600 Localized edema: Secondary | ICD-10-CM

## 2019-09-24 DIAGNOSIS — I2510 Atherosclerotic heart disease of native coronary artery without angina pectoris: Secondary | ICD-10-CM

## 2019-09-24 MED ORDER — NITROGLYCERIN 0.4 MG SL SUBL: 0.4000 mg | SUBLINGUAL_TABLET | SUBLINGUAL | 12 refills | Status: AC | PRN

## 2019-09-24 MED ORDER — POTASSIUM CHLORIDE CRYS ER 20 MEQ PO TBCR: 40.0000 meq | EXTENDED_RELEASE_TABLET | Freq: Two times a day (BID) | ORAL | 1 refills | Status: DC

## 2019-09-24 MED ORDER — ATORVASTATIN CALCIUM 40 MG PO TABS: 40.0000 mg | ORAL_TABLET | Freq: Every day | ORAL | 1 refills | Status: DC

## 2019-09-24 MED ORDER — EZETIMIBE 10 MG PO TABS: 10.0000 mg | ORAL_TABLET | Freq: Every day | ORAL | 1 refills | Status: DC

## 2019-09-24 NOTE — Progress Notes (Addendum)
Office Visit    Patient Name: Kristina Singleton Date of Encounter: 09/24/2019  Primary Care Provider:  Leane Call, PA-C Primary Cardiologist:  Gypsy Balsam, MD Electrophysiologist:  None   Chief Complaint    [name] is a 68 y.o. female with a hx of HTN, DLD, CAD s/p stent to unknown vessel in [place], LE edema, OSA, DM2 presents today for follow up of HTN, DLD, CAD.  Past Medical History    Past Medical History:  Diagnosis Date  . Diabetes mellitus without complication (HCC)    doesn't have  . Hyperlipidemia    Past Surgical History:  Procedure Laterality Date  . ARTHRODESIS    . CARDIAC CATHETERIZATION    . CESAREAN SECTION    . CHOLECYSTECTOMY, LAPAROSCOPIC    . CORONARY ANGIOPLASTY    . 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 REPLACEMENT TOTAL KNEE    . SHOULDER ARTHROSCOPY      Allergies  Allergies  Allergen Reactions  . Azithromycin Other (See Comments)  . Celecoxib     Other reaction(s): Unknown  . Morphine Nausea And Vomiting  . Duloxetine Other (See Comments)    Passing out  . Erythromycin Base     Other reaction(s): GI Upset (intolerance)  . Lisinopril Other (See Comments)    Unknown    History of Present Illness    [name] is a 68 y.o. female with a hx of HTN, DLD, CAD s/p stent to unknown vessel in [place], LE edema, OSA, DM2  last seen 05/2018 by Dr. Bing Matter.  At that time she was seen for LE edema and to establish cardiology care. 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   Reports feeling overall well. She has been working to lose weight by walking and watching what she eats. She endorses trying to eat a  heart healthy diet. Does not check BP at home but is well controlled at office visits.  She had split-night sleep study 06/2018. CPAP titration 07/2018. She was recommended for CPAP and per previous documentation 1 was ordered through [facility]. Tells me that she never heard venting and never received a CPAP. Unfortunately she never reached out to our office for further clarification or investigation. She reports she still snores when she sleeps and has morning headaches. She has an Epworth total score of 6 as detailed below. We will plan to order new sleep study.  Sitting and reading? Would never doze  Watching TV? Would never doze  Sitting, inactive in a public place (e.g. a theatre or a meeting)? Slight chance of dozing  As a passenger in a car for an hour without a break? Would never doze  Lying down to rest in the afternoon when circumstances permit? Slight chance of dozing  Sitting and talking to someone? Slight chance of dozing  Sitting quietly after a lunch without alcohol? Slight chance of dozing  In a car, while stopped for a few minutes in traffic?  Slight chance of dozing  Epworth Total Score 6     EKGs/Labs/Other Studies Reviewed:   The following studies were reviewed today:  Echo 05/2018 Study Conclusions   - Left ventricle: The cavity size was normal. Systolic function was  normal. The estimated ejection fraction was in the range of 55%   to 60%. Wall motion was normal; there were no regional wall   motion abnormalities. - Pulmonary arteries: PA peak pressure: 31 mm Hg (S).   Impressions:   - Grossly normaal echocardiogram.   ------------------------------------------------------------------- Study data:  No prior study was available for comparison.  Study status:  Routine.  Procedure:  Transthoracic echocardiography. Image quality was fair. The study was technically difficult, as a result of body habitus.  Study completion:  There were no complications.           Transthoracic echocardiography.  M-mode, complete 2D, spectral Doppler, and color Doppler.  Birthdate: Patient birthdate: 03-16-1951.  Age:  Patient is 68 yr old.  Sex: Gender: female.    BMI: 46.7 kg/m^2.  Blood pressure:     127/83 Patient status:  Outpatient.  Study date:  Study date: 06/15/2018. Study time: 09:17 AM.  Location:  Echo laboratory. ------------------------------------------------------------------- Left ventricle:  The cavity size was normal. Systolic function was normal. The estimated ejection fraction was in the range of 55% to 60%. Wall motion was normal; there were no regional wall motion abnormalities. ------------------------------------------------------------------- Aortic valve:   Trileaflet; normal thickness leaflets. Mobility was not restricted.  Doppler:  Transvalvular velocity was within the normal range. There was no stenosis. There was no regurgitation. ------------------------------------------------------------------- Aorta:  Aortic root: The aortic root was normal in size. ------------------------------------------------------------------- Mitral valve:   Structurally normal valve.   Mobility was not restricted.  Doppler:  Transvalvular velocity was within the normal range. There was no evidence for stenosis. There was no regurgitation.    Peak gradient (D): 3 mm Hg. ------------------------------------------------------------------- Left atrium:  The atrium was normal in size. ------------------------------------------------------------------- Right ventricle:  The cavity size was normal. Wall thickness was normal. Systolic function was normal. ------------------------------------------------------------------- Pulmonic valve:    Structurally normal valve.   Cusp separation was normal.  Doppler:  Transvalvular velocity was within the normal range. There was no evidence for stenosis. There was trivial regurgitation.  ------------------------------------------------------------------- Tricuspid valve:   Structurally normal valve.    Doppler: Transvalvular velocity was within the normal range. There was trivial regurgitation. ------------------------------------------------------------------- Pulmonary artery:   The main pulmonary artery was normal-sized. Systolic pressure was within the normal range. ------------------------------------------------------------------- Right atrium:  The atrium was normal in size. ------------------------------------------------------------------- Pericardium:  There was no pericardial effusion. ------------------------------------------------------------------- Systemic veins: Inferior vena cava: The vessel was normal in size.   Carotid U/S 05/2018     Final Interpretation: Right Carotid: Velocities in the right ICA are consistent with a 1-39% stenosis.                Moderate focal plaque visualized at proxima ICA indicative of                1-39% stenosis.   Left Carotid: Velocities in the left ICA are consistent with a 1-39% stenosis.               Mild focal plaque visualized at left proximal ICA indicative of               1-39% stenosis.   Vertebrals:  Bilateral vertebral arteries demonstrate antegrade flow. Subclavians: Normal flow hemodynamics were seen in bilateral subclavian arteries.  EKG:  EKG is ordered today.  The ekg ordered today demonstrates SR no acute ST/T wave changes.  Recent Labs: [facility]: 08/21/19 AST 24, ALT 25, A1c 6.3, GFR 53, creatinine 1.21, K 4.2 02/06/2019:  ALT 22  Recent Lipid Panel [facility]: 08/21/19 total cholesterol 156, HDL 61, LDL 85, triglycerides 101.    Component Value Date/Time   CHOL 165 02/06/2019 0854   TRIG 134 02/06/2019 0854   HDL 70 02/06/2019 0854   CHOLHDL 2.4 02/06/2019 0854   LDLCALC 68 02/06/2019 0854    Home Medications   Current Meds  Medication Sig  . docusate sodium  (COLACE) 100 MG capsule Take 1 capsule by mouth daily as needed.   Marland Kitchen esomeprazole (NEXIUM) 40 MG capsule Take 1 capsule by mouth 2 (two) times daily.  Marland Kitchen ezetimibe (ZETIA) 10 MG tablet Take 1 tablet (10 mg total) by mouth daily.  . fluticasone (FLONASE) 50 MCG/ACT nasal spray Place 2 sprays into both nostrils 2 (two) times daily.  . furosemide (LASIX) 40 MG tablet Take 1 tablet by mouth as needed.   Marland Kitchen losartan-hydrochlorothiazide (HYZAAR) 100-25 MG tablet Take 1 tablet by mouth daily.  . meclizine (ANTIVERT) 12.5 MG tablet Take 1-2 tablets by mouth every 6 (six) hours as needed.  . nitroGLYCERIN (NITROSTAT) 0.4 MG SL tablet Place 1 tablet (0.4 mg total) under the tongue as needed for chest pain.  Marland Kitchen olopatadine (PATANOL) 0.1 % ophthalmic solution Place 2 drops into both eyes as needed.  . potassium chloride SA (KLOR-CON) 20 MEQ tablet Take 2 tablets (40 mEq total) by mouth 2 (two) times daily.  . sucralfate (CARAFATE) 1 g tablet Take 1 g by mouth 2 (two) times daily.  . [DISCONTINUED] ezetimibe (ZETIA) 10 MG tablet Take 1 tablet by mouth daily.  . [DISCONTINUED] nitroGLYCERIN (NITROSTAT) 0.4 MG SL tablet Place 1 tablet under the tongue as needed for chest pain.  . [DISCONTINUED] potassium chloride SA (K-DUR,KLOR-CON) 20 MEQ tablet Take 2 tablets by mouth 2 (two) times daily.      Review of Systems      Review of Systems  Constitution: Negative for chills, fever and malaise/fatigue.  Cardiovascular: Negative for chest pain, dyspnea on exertion, irregular heartbeat, leg swelling, near-syncope, orthopnea and palpitations.  Respiratory: Positive for snoring. Negative for cough, shortness of breath and wheezing.   Gastrointestinal: Negative for nausea and vomiting.  Neurological: Negative for dizziness, light-headedness and weakness.   All other systems reviewed and are otherwise negative except as noted above.  Physical Exam    VS:  BP 104/70 (BP Location: Left Arm, Patient Position: Sitting,  Cuff Size: Large)   Pulse 87   Temp (!) 97 F (36.1 C) (Temporal)   Ht  (1.676 m)   Wt (!) 307 lb (139.3 kg)   SpO2 96%   BMI 49.55 kg/m  , BMI Body mass index is 49.55 kg/m. GEN: Well nourished, overweight, well developed, in no acute distress. HEENT: normal. Neck: Supple, no JVD, carotid bruits, or masses. Cardiac: RRR, no murmurs, rubs, or gallops. No clubbing, cyanosis, edema.  Radials/DP/PT 2+ and equal bilaterally.  Respiratory:  Respirations regular and unlabored, clear to auscultation bilaterally. GI: Soft, nontender, nondistended, BS + x 4. MS: No deformity or atrophy. Skin: Warm and dry, no rash. Neuro:  Strength and sensation are intact. Psych: Normal affect.  Assessment & Plan    1. CAD - s/p remote stent unknown vessel in [place]. GDMT includes Plavix, statin, ARB. She remains on Plavix as hx of intolerance with bleeding to aspirin. No anginal symptoms - no indication for ischemic evaluation.  2. HTN - BP well controlled. Continue present antihypertensive regimen.  3. OSA - Her Epworth score today is  6. She has previously diagnosed OSA associated with snoring and morning headaches. Had split night sleep study 06/2018 and recommended for CPAP with machine ordered but she tells me she never received it. Unfortunately she did not follow-up with our office or investigate further. She is still symptomatic with snoring and morning headaches. We will plan to repeat split-night sleep study.   4. Snoring - Plan, as above.   5. HLD - LDL goal <70. 08/21/19 AST 24, ALT 25, total cholesterol 156, HDL 61, LDL 85, triglycerides 101. She had stopped Zetia for unclear reason. Continue statin, resume Zetia, recheck liver/lipid 6 week.  6. Obesity - She requests assistance with losing weight. Given her information for [facility]. Encouraged her to contact to schedule an appointment as she can work with physicians, registered dietician, and psychology.  7. DM2 -  08/21/19 A1c 6.3. Follows with PCP. Congratulated on recent weight loss.   8. LE edema - Resolved, no recurrence.   Disposition: Follow up in 6 month(s) with Dr. Agustin Cree.   Loel Dubonnet, NP 09/24/2019, 4:00 PM

## 2019-09-24 NOTE — Patient Instructions (Signed)
Medication Instructions:  Your physician has recommended you make the following change in your medication:   START Zetia 10mg  daily  Our goal is for your LDL ("bad cholesterol") to be less than 70. It was 85 when checked in September. The Zetia will help to decrease it further.   *If you need a refill on your cardiac medications before your next appointment, please call your pharmacy*  Lab Work: Your physician recommends that you return for lab work in 6 weeks for lipid profile, CMET on December 8-10. Please be fasting for this lab draw. You may stop by the office 8am - 4:30 pm, please avoid 12p-1p for lunch. You do not need an appointment.   If you have labs (blood work) drawn today and your tests are completely normal, you will receive your results only by: Marland Kitchen MyChart Message (if you have MyChart) OR . A paper copy in the mail If you have any lab test that is abnormal or we need to change your treatment, we will call you to review the results.  Testing/Procedures: You had an EKG today.  Follow-Up: At [facility], you and your health needs are our priority.  As part of our continuing mission to provide you with exceptional heart care, we have created designated Provider Care Teams.  These Care Teams include your primary Cardiologist (physician) and Advanced Practice Providers (APPs -  Physician Assistants and Nurse Practitioners) who all work together to provide you with the care you need, when you need it.  Your next appointment:   6 months  The format for your next appointment:   In Person  Provider:   Jenne Campus, MD  Other Instructions  Glad to see you are doing well!  I will reach out to sleep medicine regarding your CPAP and let you know whether one will be sent or if sleep study needs repeated. Expect an update within 1 week. If not, call our office.   Recommend the [facility] for assistance with losing weight. You have done a great  job so far - keep up the good work! You can register for an information session - the phone number to do so is  [phone]. This is the same phone number to request an apointment. They are located at [address], [place], [place] [address].

## 2019-09-26 NOTE — Addendum Note (Signed)
Addended by: Loel Dubonnet on: 09/26/2019 05:14 PM   Modules accepted: Orders

## 2019-09-27 ENCOUNTER — Telehealth: Payer: Self-pay | Admitting: *Deleted

## 2019-09-27 NOTE — Telephone Encounter (Signed)
Staff message sent to Robyne Peers ok to schedule sleep study.per [facility] web portal no PA is required. Decision [id number].

## 2019-10-04 ENCOUNTER — Telehealth: Payer: Self-pay | Admitting: *Deleted

## 2019-10-04 NOTE — Telephone Encounter (Signed)
-----   Message from Lauralee Evener, [place] sent at 09/27/2019  9:10 AM EDT ----- Regarding: RE: CPAP? Ok to order sleep study. Per [facility] web portal no PA is required. ----- Message ----- From: Loel Dubonnet, NP Sent: 09/26/2019   5:14 PM EDT To: Lauralee Evener, CMA Subject: RE: CPAP?                                      I ordered 'split night sleep study' for [facility]. Let me know if that needs adjusted (I'm used to being spoiled with a nurse/CMA to help with ordering). I told her during office visit there was a possibility we would have to repeat the study and she was agreeable.  Thanks so much! ----- Message ----- From: Lauralee Evener, CMA Sent: 09/26/2019   4:02 PM EDT To: Loel Dubonnet, NP Subject: RE: CPAP?                                      Thanks! ----- Message ----- From: Loel Dubonnet, NP Sent: 09/26/2019   3:54 PM EDT To: Lauralee Evener, CMA Subject: RE: CPAP?                                      Thank you for investigating! I will addend my note and place the referral.   (I thought about asking that question but didn't trust that I could ask it without giving attitude by mistake)  Loel Dubonnet, NP ----- Message ----- From: Lauralee Evener, CMA Sent: 09/26/2019   3:45 PM EDT To: Loel Dubonnet, NP Subject: RE: CPAP?                                      After investigating she will need to start over with a new sleep study. Make reference in your note her current symptoms and that she was never set up therefore you will be ordering a new sleep study due to expired timing. ( why didn't she call us to inform us that she hadn't heard from anyone?)  Thanks  Mariann Laster  ----- Message ----- From: Loel Dubonnet, NP Sent: 09/24/2019   3:57 PM EDT To: Lauralee Evener, CMA Subject: CPAP?                                          Hi!  Per your last note on Mrs. Lofgren she had order for CPAP sent to [facility] and pt states she never received it. (She also  didn't follow up). It has been about a year. Do you anticipate need to repeat sleep study? Or re-refer to sleep medicine? Wasn't sure how long the auth/precert/testing data lasted for. Just trying to find the most effective route to get her the CPAP.  Best, Loel Dubonnet, NP

## 2019-10-04 NOTE — Telephone Encounter (Signed)
Attempted to contact the [place] [facility] to schedule sleep study with no answer. Will continue efforts.

## 2019-10-11 NOTE — Telephone Encounter (Signed)
Patient has been scheduled for her sleep study at [facility] on Monday, 10/28/2019. Patient will go to the [facility] on Saturday, 10/26/2019, at 10:00 am for pre-procedure COVID testing. Patient informed and is agreeable to plan. She was provided with the [facility] address and advised that further information will be mailed to her. Patient verbalized understanding. No further questions.

## 2019-10-26 ENCOUNTER — Other Ambulatory Visit (HOSPITAL_COMMUNITY)
Admission: RE | Admit: 2019-10-26 | Discharge: 2019-10-26 | Disposition: A | Discharge status: Home / Self Care | Payer: Medicare Other | Source: Ambulatory Visit | Attending: Cardiovascular Disease | Admitting: Cardiovascular Disease

## 2019-10-26 DIAGNOSIS — Z01812 Encounter for preprocedural laboratory examination: Secondary | ICD-10-CM | POA: Diagnosis present

## 2019-10-26 DIAGNOSIS — Z20828 Contact with and (suspected) exposure to other viral communicable diseases: Secondary | ICD-10-CM | POA: Insufficient documentation

## 2019-10-27 LAB — NOVEL CORONAVIRUS, NAA (HOSP ORDER, SEND-OUT TO REF LAB; TAT 18-24 HRS): SARS-CoV-2, NAA: NOT DETECTED

## 2019-10-28 ENCOUNTER — Other Ambulatory Visit: Payer: Self-pay

## 2019-10-28 ENCOUNTER — Ambulatory Visit (HOSPITAL_BASED_OUTPATIENT_CLINIC_OR_DEPARTMENT_OTHER): Payer: Medicare Other | Attending: Family | Admitting: Cardiovascular Disease

## 2019-10-28 DIAGNOSIS — Z79899 Other long term (current) drug therapy: Secondary | ICD-10-CM | POA: Insufficient documentation

## 2019-10-28 DIAGNOSIS — G4733 Obstructive sleep apnea (adult) (pediatric): Secondary | ICD-10-CM

## 2019-10-28 DIAGNOSIS — R0902 Hypoxemia: Secondary | ICD-10-CM | POA: Diagnosis not present

## 2019-10-28 DIAGNOSIS — R519 Headache, unspecified: Secondary | ICD-10-CM | POA: Diagnosis not present

## 2019-10-28 HISTORY — DX: Obstructive sleep apnea (adult) (pediatric): G47.33

## 2019-11-03 ENCOUNTER — Encounter (HOSPITAL_BASED_OUTPATIENT_CLINIC_OR_DEPARTMENT_OTHER): Payer: Self-pay | Admitting: Cardiovascular Disease

## 2019-11-03 DIAGNOSIS — K625 Hemorrhage of anus and rectum: Secondary | ICD-10-CM

## 2019-11-03 DIAGNOSIS — K5900 Constipation, unspecified: Secondary | ICD-10-CM

## 2019-11-03 HISTORY — DX: Constipation, unspecified: K59.00

## 2019-11-03 HISTORY — DX: Hemorrhage of anus and rectum: K62.5

## 2019-11-03 NOTE — Procedures (Signed)
Patient Name: Kristina Singleton, Kristina Singleton Date: 10/28/2019 Gender: Female D.O.B: [birth year] Age (years): 53 Referring Provider: Loel Dubonnet NP Height (inches): 66 Interpreting Physician: Shelva Majestic MD, ABSM Weight (lbs): 302 RPSGT: Carolin Coy BMI: 69 MRN: [id number] Neck Size: 17.50  CLINICAL INFORMATION Sleep Study Type: NPSG  Indication for sleep study: COPD, Fatigue, Hypertension, Obesity, Snoring  Epworth Sleepiness Score: 0  Most recent polysomnogram dated 07/03/2018 revealed an AHI of 8.9/h and RDI of 19.0/h. Most recent titration study dated 08/14/2018 was optimal at 16 cm H2O with an AHI of 11.2/h.  SLEEP STUDY TECHNIQUE As per the AASM Manual for the Scoring of Sleep and Associated Events v2.3 (April 2016) with a hypopnea requiring 4% desaturations.  The channels recorded and monitored were frontal, central and occipital EEG, electrooculogram (EOG), submentalis EMG (chin), nasal and oral airflow, thoracic and abdominal wall motion, anterior tibialis EMG, snore microphone, electrocardiogram, and pulse oximetry.  MEDICATIONS     albuterol (PROAIR HFA) 108 (90 Base) MCG/ACT inhaler             atorvastatin (LIPITOR) 40 MG tablet         cetirizine (ZYRTEC) 10 MG tablet         cloNIDine (CATAPRES) 0.3 MG tablet         clopidogrel (PLAVIX) 75 MG tablet         cyclobenzaprine (FLEXERIL) 10 MG tablet         docusate sodium (COLACE) 100 MG capsule         esomeprazole (NEXIUM) 40 MG capsule         ezetimibe (ZETIA) 10 MG tablet         fluticasone (FLONASE) 50 MCG/ACT nasal spray         furosemide (LASIX) 40 MG tablet         levETIRAcetam (KEPPRA) 250 MG tablet (Expired)         losartan-hydrochlorothiazide (HYZAAR) 100-25 MG tablet         meclizine (ANTIVERT) 12.5 MG tablet         meloxicam (MOBIC) 7.5 MG tablet         nitroGLYCERIN (NITROSTAT) 0.4 MG SL tablet         olopatadine (PATANOL) 0.1 % ophthalmic solution         potassium chloride SA  (KLOR-CON) 20 MEQ tablet         ranitidine (ZANTAC) 150 MG tablet         sucralfate (CARAFATE) 1 g tablet      Medications self-administered by patient taken the night of the study : CLONIDINE, FLEXERIL, HYDRALAZINE, LIPITOR, NEXIUM, POTASSIUM CHLORIDE, zetia  SLEEP ARCHITECTURE The study was initiated at 10:19:03 PM and ended at 4:59:26 AM.  Sleep onset time was 57.9 minutes and the sleep efficiency was 69.8%%. The total sleep time was 279.5 minutes.  Stage REM latency was 230.5 minutes.  The patient spent 16.5%% of the night in stage N1 sleep, 76.2%% in stage N2 sleep, 0.0%% in stage N3 and 7.3% in REM.  Alpha intrusion was absent.  Supine sleep was 12.34%.  RESPIRATORY PARAMETERS The overall apnea/hypopnea index (AHI) was 24.0 per hour. The respiratory disturbance index (RDI) was 35.4/h. There were 30 total apneas, including 30 obstructive, 0 central and 0 mixed apneas. There were 82 hypopneas and 53 RERAs.  The AHI during Stage REM sleep was 137.6 per hour.  AHI while supine was 17.4 per hour.  The mean oxygen saturation was 95.0%. The  minimum SpO2 during sleep was 78.0%.  Moderate snoring was noted during this study.  CARDIAC DATA The 2 lead EKG demonstrated sinus rhythm. The mean heart rate was 70.1 beats per minute. Other EKG findings include: None.  LEG MOVEMENT DATA The total PLMS were 0 with a resulting PLMS index of 0.0. Associated arousal with leg movement index was 0.2 .  IMPRESSIONS - Moderate obstructive sleep apnea overall (AHI 24.0/h; RDI 35.4); however, events were very severe during REM sleep (AHI 137.6/h) - No significant central sleep apnea occurred during this study (CAI = 0.0/h). - Significant oxygen desaturation to a nadir of 78.0%. - The patient snored with moderate snoring volume. - No cardiac abnormalities were noted during this study. - Clinically significant periodic limb movements did not occur during sleep. No significant associated arousals.   DIAGNOSIS - Obstructive Sleep Apnea (327.23 [G47.33 ICD-10]) - Nocturnal Hypoxemia (327.26 [G47.36 ICD-10])  RECOMMENDATIONS - Therapeutic CPAP titration to determine optimal pressure required to alleviate sleep disordered breathing. - Effort should be made to optimize nasal and oropharyngeal patency. - Avoid alcohol, sedatives and other CNS depressants that may worsen sleep apnea and disrupt normal sleep architecture. - Sleep hygiene should be reviewed to assess factors that may improve sleep quality. - Weight management (BMI 49) and regular exercise should be initiated or continued if appropriate.  [Electronically signed] 11/03/2019 02:30 PM  Nicki Guadalajara MD, [facility], ABSM Diplomate, American Board of Sleep Medicine   NPI: [id number] [facility] [facility] PH: [phone]   FX: [phone] ACCREDITED BY THE AMERICAN ACADEMY OF SLEEP MEDICINE

## 2019-11-06 ENCOUNTER — Other Ambulatory Visit: Payer: Self-pay | Admitting: Cardiovascular Disease

## 2019-11-06 ENCOUNTER — Telehealth: Payer: Self-pay | Admitting: *Deleted

## 2019-11-06 DIAGNOSIS — G4736 Sleep related hypoventilation in conditions classified elsewhere: Secondary | ICD-10-CM

## 2019-11-06 DIAGNOSIS — G4733 Obstructive sleep apnea (adult) (pediatric): Secondary | ICD-10-CM

## 2019-11-06 DIAGNOSIS — I2510 Atherosclerotic heart disease of native coronary artery without angina pectoris: Secondary | ICD-10-CM

## 2019-11-06 DIAGNOSIS — J449 Chronic obstructive pulmonary disease, unspecified: Secondary | ICD-10-CM

## 2019-11-06 DIAGNOSIS — IMO0002 Reserved for concepts with insufficient information to code with codable children: Secondary | ICD-10-CM

## 2019-11-06 NOTE — Telephone Encounter (Signed)
-----   Message from Troy Sine, MD sent at 11/03/2019  2:35 PM EST ----- Mariann Laster, please notify pt and set up for CPAP titration study

## 2019-11-06 NOTE — Telephone Encounter (Signed)
Patient notified of sleep study results and recommendations. Agrees to do CPAP titration. Titration appointment and COVID details given to patient.

## 2019-11-08 ENCOUNTER — Encounter: Payer: Self-pay | Admitting: Gastroenterology

## 2019-11-15 ENCOUNTER — Other Ambulatory Visit (HOSPITAL_COMMUNITY)
Admission: RE | Admit: 2019-11-15 | Discharge: 2019-11-15 | Disposition: A | Discharge status: Home / Self Care | Payer: Medicare Other | Source: Ambulatory Visit | Attending: Cardiovascular Disease | Admitting: Cardiovascular Disease

## 2019-11-15 DIAGNOSIS — Z01812 Encounter for preprocedural laboratory examination: Secondary | ICD-10-CM | POA: Insufficient documentation

## 2019-11-15 DIAGNOSIS — Z20828 Contact with and (suspected) exposure to other viral communicable diseases: Secondary | ICD-10-CM | POA: Diagnosis not present

## 2019-11-15 LAB — SARS CORONAVIRUS 2 (TAT 6-24 HRS): SARS Coronavirus 2: NEGATIVE

## 2019-11-18 ENCOUNTER — Other Ambulatory Visit: Payer: Self-pay

## 2019-11-18 ENCOUNTER — Ambulatory Visit (HOSPITAL_BASED_OUTPATIENT_CLINIC_OR_DEPARTMENT_OTHER): Payer: Medicare Other | Attending: Cardiovascular Disease | Admitting: Cardiovascular Disease

## 2019-11-18 DIAGNOSIS — Z791 Long term (current) use of non-steroidal anti-inflammatories (NSAID): Secondary | ICD-10-CM | POA: Insufficient documentation

## 2019-11-18 DIAGNOSIS — Z7902 Long term (current) use of antithrombotics/antiplatelets: Secondary | ICD-10-CM | POA: Insufficient documentation

## 2019-11-18 DIAGNOSIS — Z79899 Other long term (current) drug therapy: Secondary | ICD-10-CM | POA: Diagnosis not present

## 2019-11-18 DIAGNOSIS — IMO0002 Reserved for concepts with insufficient information to code with codable children: Secondary | ICD-10-CM

## 2019-11-18 DIAGNOSIS — G4733 Obstructive sleep apnea (adult) (pediatric): Secondary | ICD-10-CM | POA: Diagnosis not present

## 2019-11-18 DIAGNOSIS — J449 Chronic obstructive pulmonary disease, unspecified: Secondary | ICD-10-CM

## 2019-11-18 DIAGNOSIS — G4736 Sleep related hypoventilation in conditions classified elsewhere: Secondary | ICD-10-CM | POA: Diagnosis not present

## 2019-11-18 DIAGNOSIS — I2510 Atherosclerotic heart disease of native coronary artery without angina pectoris: Secondary | ICD-10-CM | POA: Diagnosis not present

## 2019-11-18 DIAGNOSIS — Z7951 Long term (current) use of inhaled steroids: Secondary | ICD-10-CM | POA: Insufficient documentation

## 2019-11-19 ENCOUNTER — Other Ambulatory Visit (HOSPITAL_BASED_OUTPATIENT_CLINIC_OR_DEPARTMENT_OTHER): Payer: Self-pay

## 2019-11-19 DIAGNOSIS — G4736 Sleep related hypoventilation in conditions classified elsewhere: Secondary | ICD-10-CM

## 2019-11-19 DIAGNOSIS — G4733 Obstructive sleep apnea (adult) (pediatric): Secondary | ICD-10-CM

## 2019-11-19 DIAGNOSIS — IMO0002 Reserved for concepts with insufficient information to code with codable children: Secondary | ICD-10-CM

## 2019-11-19 DIAGNOSIS — J449 Chronic obstructive pulmonary disease, unspecified: Secondary | ICD-10-CM

## 2019-11-19 DIAGNOSIS — I2510 Atherosclerotic heart disease of native coronary artery without angina pectoris: Secondary | ICD-10-CM

## 2019-11-20 NOTE — Procedures (Signed)
Patient Name: Kristina Singleton, Nelon Date: 11/18/2019 Gender: Female D.O.B: 05/19/1951 Age (years): 78 Referring Provider: Loel Dubonnet NP Height (inches): 66 Interpreting Physician: Shelva Majestic MD, ABSM Weight (lbs): 302 RPSGT: Carolin Coy BMI: 66 MRN: [id number] Neck Size: 17.50  CLINICAL INFORMATION The patient is referred for a BiPAP titration to treat sleep apnea.  Date of NPSG: 10/28/2019: AHI 24.0/h; RDI 35.4/h; AHI during REM sleep 137.6/h; O2 nadir 78%.  SLEEP STUDY TECHNIQUE As per the AASM Manual for the Scoring of Sleep and Associated Events v2.3 (April 2016) with a hypopnea requiring 4% desaturations.  The channels recorded and monitored were frontal, central and occipital EEG, electrooculogram (EOG), submentalis EMG (chin), nasal and oral airflow, thoracic and abdominal wall motion, anterior tibialis EMG, snore microphone, electrocardiogram, and pulse oximetry. Bilevel positive airway pressure (BPAP) was initiated at the beginning of the study and titrated to treat sleep-disordered breathing.  MEDICATIONS albuterol (PROAIR HFA) 108 (90 Base) MCG/ACT inhaler  atorvastatin (LIPITOR) 40 MG tablet  cetirizine (ZYRTEC) 10 MG tablet  cloNIDine (CATAPRES) 0.3 MG tablet  clopidogrel (PLAVIX) 75 MG tablet  cyclobenzaprine (FLEXERIL) 10 MG tablet  docusate sodium (COLACE) 100 MG capsule  esomeprazole (NEXIUM) 40 MG capsule  ezetimibe (ZETIA) 10 MG tablet  fluticasone (FLONASE) 50 MCG/ACT nasal spray  furosemide (LASIX) 40 MG tablet  levETIRAcetam (KEPPRA) 250 MG tablet(Expired)  losartan-hydrochlorothiazide (HYZAAR) 100-25 MG tablet  meclizine (ANTIVERT) 12.5 MG tablet  meloxicam (MOBIC) 7.5 MG tablet  nitroGLYCERIN (NITROSTAT) 0.4 MG SL tablet  olopatadine (PATANOL) 0.1 % ophthalmic solution  potassium chloride SA (KLOR-CON) 20 MEQ tablet  ranitidine (ZANTAC) 150 MG tablet  sucralfate (CARAFATE) 1 g tablet  Medications self-administered by patient  taken the night of the study : CLONIDINE, FLEXERIL, HYDRALAZINE, LIPITOR, NEXIUM, POTASSIUM CHLORIDE, zetia  RESPIRATORY PARAMETERS Optimal IPAP Pressure (cm): 24 AHI at Optimal Pressure (/hr) 0.0 Optimal EPAP Pressure (cm): 20   Overall Minimal O2 (%): 92.0 Minimal O2 at Optimal Pressure (%): 95.0  SLEEP ARCHITECTURE Start Time: 10:38:29 PM Stop Time: 4:46:36 AM Total Time (min): 368.1 Total Sleep Time (min): 266 Sleep Latency (min): 29.4 Sleep Efficiency (%): 72.3% REM Latency (min): 163.5 WASO (min): 72.7 Stage N1 (%): 16.9% Stage N2 (%): 72.2% Stage N3 (%): 0.8% Stage R (%): 10.2 Supine (%): 32.14 Arousal Index (/hr): 26.6   CARDIAC DATA The 2 lead EKG demonstrated sinus rhythm. The mean heart rate was 66.9 beats per minute. Other EKG findings include: None.  LEG MOVEMENT DATA The total Periodic Limb Movements of Sleep (PLMS) were 0. The PLMS index was 0.0. A PLMS index of <15 is considered normal in adults.  IMPRESSIONS - CPAP was started at 5 cm and was titrated to 19 cm. Due to continued events BiPAP was initiated at 21/17 and advanced to 24/20 cm of water. AHI at 24/20 was 0 with O2 nadir 95% - Central sleep apnea was not noted during this titration (CAI = 3.4/h). - Significant oxygen desaturations were not observed during this titration (min O2 = 92.0%). - The patient snored with moderate snoring volume. - No significant cardiac abnormalities were observed during this study; rare isolated PAC. - Clinically significant periodic limb movements were not noted during this study. Arousals associated with PLMs were rare.  DIAGNOSIS - Obstructive Sleep Apnea (327.23 [G47.33 ICD-10])  RECOMMENDATIONS - Trial of BiPAP therapy on 24/20 cm H2O with heated humidification.  A Medium size Fisher&Paykel Full Face Mask F&P Vitera (new) mask was used for the titration. - Effort  should be made to optimize nasal and oropharyngeal patency. - Avoid alcohol, sedatives and other CNS depressants  that may worsen sleep apnea and disrupt normal sleep architecture. - Sleep hygiene should be reviewed to assess factors that may improve sleep quality. - Weight management and regular exercise should be initiated or continued. - Recommend an download in 30 days and sleep clinic evaluation after 4 weeks of therapy.  [Electronically signed] 11/20/2019 06:29 PM  Nicki Guadalajara MD, [facility], ABSM Diplomate, American Board of Sleep Medicine   NPI: [id number] [place] [facility] PH: [phone]   FX: [phone] ACCREDITED BY THE AMERICAN ACADEMY OF SLEEP MEDICINE

## 2019-11-21 ENCOUNTER — Telehealth: Payer: Self-pay | Admitting: *Deleted

## 2019-11-21 NOTE — Telephone Encounter (Signed)
BIPAP order faxed to [facility]

## 2019-12-03 ENCOUNTER — Other Ambulatory Visit: Payer: Self-pay

## 2019-12-03 ENCOUNTER — Encounter: Payer: Self-pay | Admitting: Gastroenterology

## 2019-12-03 ENCOUNTER — Telehealth: Payer: Self-pay

## 2019-12-03 ENCOUNTER — Ambulatory Visit (INDEPENDENT_AMBULATORY_CARE_PROVIDER_SITE_OTHER): Payer: Medicare Other | Admitting: Gastroenterology

## 2019-12-03 VITALS — BP 122/76 | HR 87 | Temp 98.2°F | Ht 65.5 in | Wt 302.2 lb

## 2019-12-03 DIAGNOSIS — Z800 Family history of malignant neoplasm of digestive organs: Secondary | ICD-10-CM | POA: Diagnosis not present

## 2019-12-03 DIAGNOSIS — K625 Hemorrhage of anus and rectum: Secondary | ICD-10-CM

## 2019-12-03 DIAGNOSIS — Z86010 Personal history of colonic polyps: Secondary | ICD-10-CM

## 2019-12-03 DIAGNOSIS — Z1159 Encounter for screening for other viral diseases: Secondary | ICD-10-CM

## 2019-12-03 MED ORDER — CLENPIQ 10-3.5-12 MG-GM -GM/160ML PO SOLN: 1.0000 | Freq: Once | ORAL | 0 refills | Status: AC

## 2019-12-03 MED ORDER — FAMOTIDINE 40 MG PO TABS: 40.0000 mg | ORAL_TABLET | Freq: Every day | ORAL | 0 refills | Status: DC

## 2019-12-03 NOTE — Progress Notes (Signed)
Chief Complaint:   Referring Provider:  Maggie Schwalbe, PA-C      ASSESSMENT AND PLAN;   #1. Rectal bleeding. D/d hoids, AVMs, colitis, polyps, stercoral ulcers etc, r/o colonic neoplasms or IBD. Nl CBC 07/2019  #2. H/O polyps  #3. FH colon cancer (sis at age 69)  #4. GERD  Plan: - Proceed with colonoscopy 2 day prep after cardiology clearance (Dr Raliegh Ip). Discussed risks & benefits. (Risks including rare perforation req laparotomy, bleeding after bx/polypectomy req blood transfusion, rarely missing neoplasms, risks of anesthesia/sedation). Benefits outweigh the risks. Patient agrees to proceed. All the questions were answered. Consent forms given for review.  - Hold plavix 5 days before. - Continue Nexium 40 mg bid - Add pepcid 30m po qhs x 4 weeks. - Nonpharmacologic means of reflux control was stressed.   HPI:    Kristina Shaheenis a 69y.o. female  Rectal bleeding x 4 to 6 weeks, intermittent, mostly on the tissue, without any abdominal or rectal pain.  Occurs mostly when she strains to have bowel movements.  No melena or hematochezia.  No weight loss.  Quite concerned due to family history of colon cancer.  Had x-ray KUB which showed constipation.  She has been started on Metamucil.  CBC revealed normal hemoglobin 12.6, MCV 95 08/23/2019.  She is also taking Plavix and has been under care of Dr. KRaliegh Ip  Intermittent heartburn ever since she has stopped taking Zantac (taken off the market).  She has been using Nexium twice a day.  No odynophagia or dysphagia.  No fever chills or night sweats.   Past GI procedures: -Colonoscopy 08/2014 poor preparation, 1 cm colonic polyp SP polypectomy (Bx- TA), mild sigmoid diverticulosis.  Advised to repeat colonoscopy.  Did get letters. -EGD 06/2013: Small hiatal hernia, healed esophageal erosions. 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 Past Medical History:  Diagnosis Date  . Arthritis   . CAD  (coronary artery disease)    with stent placement  . Diabetes mellitus without complication ([place]    doesn't have  . Family history of colon cancer   . Fibromyalgia   . GERD (gastroesophageal reflux disease)   . Hyperlipidemia   . Hypertension   . Lower back pain    due to OA  . Obesity   . Personal history of colonic polyps     Past Surgical History:  Procedure Laterality Date  . ARTHRODESIS    . CARDIAC CATHETERIZATION    . CESAREAN SECTION    . CHOLECYSTECTOMY, LAPAROSCOPIC    . COLONOSCOPY  09/17/2014   Colonic polyps status postr polypectomy. Mild sigmoid diverticulosis. Limited examination of the right side of the colon due to the quality of preparation.   . CORONARY ANGIOPLASTY    . ESOPHAGOGASTRODUODENOSCOPY  07/17/2013   Small hiatal hernia. Healed esophageal erosions (biopsied)   . FOOT SURGERY Left 2017   and in 2018 had to restructue it again  . HYSTEROSCOPY    . 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 REPLACEMENT TOTAL KNEE  2016  . SHOULDER ARTHROSCOPY      Family History  Problem Relation Age of Onset  . Heart attack Father   . Hypertension Father   . Hypertension Mother   . Heart disease Mother   . CAD Mother   . Colon cancer Sister   . Arthritis Daughter   . Breast cancer Neg Hx   . Esophageal cancer Neg Hx  Social History   Tobacco Use  . Smoking status: Former Research scientist (life sciences)  . Smokeless tobacco: Never Used  Substance Use Topics  . Alcohol use: Yes    Alcohol/week: 2.0 standard drinks    Types: 2 Glasses of wine per week  . Drug use: Never    Current Outpatient Medications  Medication Sig Dispense Refill  . albuterol (PROAIR HFA) 108 (90 Base) MCG/ACT inhaler Inhale 2 puffs into the lungs every 6 (six) hours as needed.    . AMBULATORY NON FORMULARY MEDICATION 2 (two) times daily. CBD oil    . atorvastatin (LIPITOR) 40 MG tablet Take 1 tablet (40 mg total) by mouth daily. 90 tablet 1  . cetirizine (ZYRTEC) 10 MG tablet  Take 1 mg by mouth daily.    . cloNIDine (CATAPRES) 0.3 MG tablet Take 1 tablet by mouth daily.    . clopidogrel (PLAVIX) 75 MG tablet Take 1 tablet by mouth daily.    . cyclobenzaprine (FLEXERIL) 10 MG tablet Take 1 tablet by mouth 2 (two) times daily.     Marland Kitchen docusate sodium (COLACE) 100 MG capsule Take 1 capsule by mouth daily as needed.     Marland Kitchen esomeprazole (NEXIUM) 40 MG capsule Take 1 capsule by mouth 2 (two) times daily.    Marland Kitchen ezetimibe (ZETIA) 10 MG tablet Take 1 tablet (10 mg total) by mouth daily. 90 tablet 1  . fluticasone (FLONASE) 50 MCG/ACT nasal spray Place 2 sprays into both nostrils 2 (two) times daily.    . furosemide (LASIX) 40 MG tablet Take 1 tablet by mouth as needed.     . hydrALAZINE (APRESOLINE) 25 MG tablet Take 25 mg by mouth 3 (three) times daily.    . hydrochlorothiazide (HYDRODIURIL) 25 MG tablet Take 25 mg by mouth daily.    Marland Kitchen NAPROXEN PO Take 2 tablets by mouth as needed.    . nitroGLYCERIN (NITROSTAT) 0.4 MG SL tablet Place 1 tablet (0.4 mg total) under the tongue as needed for chest pain. 10 tablet 12  . olmesartan (BENICAR) 40 MG tablet Take 40 mg by mouth daily.    Marland Kitchen olopatadine (PATANOL) 0.1 % ophthalmic solution Place 2 drops into both eyes as needed.    . potassium chloride SA (KLOR-CON) 20 MEQ tablet Take 2 tablets (40 mEq total) by mouth 2 (two) times daily. 360 tablet 1  . sucralfate (CARAFATE) 1 g tablet Take 1 g by mouth 2 (two) times daily.    Marland Kitchen levETIRAcetam (KEPPRA) 250 MG tablet Take 1 tablet by mouth at bedtime.      No current facility-administered medications for this visit.    Allergies  Allergen Reactions  . Azithromycin Other (See Comments)  . Celecoxib     Other reaction(s): Unknown  . Morphine Nausea And Vomiting  . Duloxetine Other (See Comments)    Passing out  . Erythromycin Base     Other reaction(s): GI Upset (intolerance)  . Lisinopril Other (See Comments)    Unknown    Review of Systems:  Constitutional: Denies fever,  chills, diaphoresis, appetite change and has fatigue.  HEENT: Denies photophobia, eye pain, redness, hearing loss, ear pain, congestion, sore throat, rhinorrhea, sneezing, mouth sores, neck pain, neck stiffness and tinnitus.   Respiratory: Denies SOB, DOE, cough, chest tightness,  and wheezing.   Cardiovascular: Denies chest pain, palpitations and leg swelling.  Genitourinary: Denies dysuria, urgency, frequency, hematuria, flank pain and difficulty urinating.  Musculoskeletal: Has myalgias, back pain, joint swelling, arthralgias and gait problem.  Skin:  No rash.  Neurological: Denies dizziness, seizures, syncope, weakness, light-headedness, numbness and headaches.  Hematological: Denies adenopathy. Easy bruising, personal or family bleeding history  Psychiatric/Behavioral: Has anxiety or depression     Physical Exam:    Today's Vitals   12/03/19 1543  BP: 122/76  Pulse: 87  Temp: 98.2 F (36.8 C)  Weight: (!) 302 lb 4 oz (137.1 kg)  Height: 5' 5.5" (1.664 m)   Body mass index is 49.53 kg/m.  Constitutional:  Well-developed, in no acute distress. Psychiatric: Normal mood and affect. Behavior is normal. HEENT: Pupils normal.  Conjunctivae are normal. No scleral icterus. Neck supple.  Cardiovascular: Normal rate, regular rhythm. No edema Pulmonary/chest: Effort normal and breath sounds normal. No wheezing, rales or rhonchi. Abdominal: Soft, nondistended. Nontender. Bowel sounds active throughout. There are no masses palpable. No hepatomegaly. Rectal: To be performed at the time of colonoscopy Neurological: Alert and oriented to person place and time. Skin: Skin is warm and dry. No rashes noted.  Data Reviewed: I have personally reviewed following labs and imaging studies  CBC: No flowsheet data found.  CMP: CMP Latest Ref Rng & Units 02/06/2019  AST 0 - 40 IU/L 24  ALT 0 - 32 IU/L 22   Labs and notes reviewed.  08/21/2019 normal CMP except glucose 129.  Hemoglobin A1c 6.3.   Alk phos 197.  Normal AST/ALT.  CBC with hemoglobin 12.2, MCV 95, platelets 246, WBC count 4.2.     Carmell Austria, MD 12/03/2019, 3:54 PM  Cc: Maggie Schwalbe, PA-C

## 2019-12-03 NOTE — Patient Instructions (Addendum)
If you are age 69 or older, your body mass index should be between 23-30. Your Body mass index is 49.53 kg/m. If this is out of the aforementioned range listed, please consider follow up with your Primary Care Provider.  If you are age 43 or younger, your body mass index should be between 19-25. Your Body mass index is 49.53 kg/m. If this is out of the aformentioned range listed, please consider follow up with your Primary Care Provider.   You have been scheduled for a colonoscopy. Please follow written instructions given to you at your visit today.  Please pick up your prep supplies at the pharmacy within the next 1-3 days. If you use inhalers (even only as needed), please bring them with you on the day of your procedure. Your physician has requested that you go to www.startemmi.com and enter the access code given to you at your visit today. This web site gives a general overview about your procedure. However, you should still follow specific instructions given to you by our office regarding your preparation for the procedure.  Two days before your procedure: Mix 3 packs (or capfuls) of Miralax in 48 ounces of clear liquid and drink at 6pm.  We have sent the following medications to your pharmacy for you to pick up at your convenience: Pepcid  You will be contacted by our office prior to your procedure for directions on holding your Plavix.  If you do not hear from our office 1 week prior to your scheduled procedure, please call [phone] to discuss.   Thank you,  Dr. Lynann Bologna

## 2019-12-03 NOTE — Telephone Encounter (Signed)
[facility] Pre-operative Risk Assessment     Request for surgical clearance:     Endoscopy Procedure  What type of surgery is being performed?     Colon   When is this surgery scheduled?     12/13/19  What type of clearance is required ?   Pharmacy  Are there any medications that need to be held prior to surgery and how long? Plavix 5 days   Practice name and name of physician performing surgery?      [facility]  What is your office phone and fax number?      Phone- [phone]  [phone]  Anesthesia type (None, local, MAC, general) ?       MAC

## 2019-12-04 NOTE — Telephone Encounter (Signed)
   Primary Cardiologist: Gypsy Balsam, MD  Chart reviewed as part of pre-operative protocol coverage. She has a history of CAD s/p remote stenting to unknown vessel in 2003. She can hold plavix 5 days prior to her colonoscopy and should restart this medication as soon as she is cleared to do so by her gastroenterologist.   I will route this recommendation to the requesting party via Epic fax function and remove from pre-op pool.  Please call with questions.  Beatriz Stallion, PA-C 12/04/2019, 9:10 AM

## 2019-12-06 ENCOUNTER — Encounter: Payer: Medicare Other | Admitting: Gastroenterology

## 2019-12-10 ENCOUNTER — Other Ambulatory Visit: Payer: Self-pay

## 2019-12-10 MED ORDER — POLYETHYLENE GLYCOL 3350 17 G PO PACK: PACK | ORAL | 0 refills | Status: DC

## 2019-12-11 ENCOUNTER — Other Ambulatory Visit: Payer: Self-pay | Admitting: Gastroenterology

## 2019-12-11 ENCOUNTER — Ambulatory Visit (INDEPENDENT_AMBULATORY_CARE_PROVIDER_SITE_OTHER): Payer: Medicare Other

## 2019-12-11 DIAGNOSIS — Z1159 Encounter for screening for other viral diseases: Secondary | ICD-10-CM

## 2019-12-12 LAB — SARS CORONAVIRUS 2 (TAT 6-24 HRS): SARS Coronavirus 2: NEGATIVE

## 2019-12-13 ENCOUNTER — Other Ambulatory Visit: Payer: Self-pay

## 2019-12-13 ENCOUNTER — Ambulatory Visit (AMBULATORY_SURGERY_CENTER): Payer: Medicare Other | Admitting: Gastroenterology

## 2019-12-13 ENCOUNTER — Encounter: Payer: Self-pay | Admitting: Gastroenterology

## 2019-12-13 VITALS — BP 130/71 | HR 70 | Temp 97.8°F | Resp 17 | Ht 65.0 in | Wt 302.0 lb

## 2019-12-13 DIAGNOSIS — K648 Other hemorrhoids: Secondary | ICD-10-CM

## 2019-12-13 DIAGNOSIS — K5730 Diverticulosis of large intestine without perforation or abscess without bleeding: Secondary | ICD-10-CM | POA: Diagnosis not present

## 2019-12-13 DIAGNOSIS — K625 Hemorrhage of anus and rectum: Secondary | ICD-10-CM

## 2019-12-13 MED ORDER — HYDROCORTISONE (PERIANAL) 2.5 % EX CREA: 1.0000 "application " | TOPICAL_CREAM | Freq: Two times a day (BID) | CUTANEOUS | 2 refills | Status: DC | PRN

## 2019-12-13 MED ORDER — SODIUM CHLORIDE 0.9 % IV SOLN: 500.0000 mL | Freq: Once | INTRAVENOUS | Status: DC

## 2019-12-13 NOTE — Op Note (Signed)
[facility] Patient Name: Kristina Singleton Procedure Date: 12/13/2019 9:02 AM MRN: [id number] Endoscopist: Lynann Bologna , MD Age: 69 Referring MD:  Date of Birth: Jul 17, 1951 Gender: Female Account #: [id number] Procedure:                Colonoscopy Indications:              #1. Rectal bleeding. Nl CBC 07/2019                           #2. H/O polyps                           #3. FH colon cancer (sis at age 44) Medicines:                Monitored Anesthesia Care Procedure:                Pre-Anesthesia Assessment:                           - Prior to the procedure, a History and Physical                            was performed, and patient medications and                            allergies were reviewed. The patient's tolerance of                            previous anesthesia was also reviewed. The risks                            and benefits of the procedure and the sedation                            options and risks were discussed with the patient.                            All questions were answered, and informed consent                            was obtained. Prior Anticoagulants: The patient has                            taken Plavix (clopidogrel), last dose was 5 days                            prior to procedure. ASA Grade Assessment: III - A                            patient with severe systemic disease. After                            reviewing the risks and benefits, the patient was  deemed in satisfactory condition to undergo the                            procedure.                           After obtaining informed consent, the colonoscope                            was passed under direct vision. Throughout the                            procedure, the patient's blood pressure, pulse, and                            oxygen saturations were monitored continuously. The                            Colonoscope was introduced  through the anus and                            advanced to the the cecum, identified by the                            ileocecal valve. The colonoscopy was performed with                            moderate difficulty due to a tortuous colon and the                            patient's body habitus. The patient tolerated the                            procedure well. The quality of the bowel                            preparation was good. The ileocecal valve was                            photographed. Cecum was visualized but never                            intubated despite abdominal pressure, changing                            patient's position. Scope In: 9:05:38 AM Scope Out: 9:20:41 AM Scope Withdrawal Time: 0 hours 4 minutes 35 seconds  Total Procedure Duration: 0 hours 15 minutes 3 seconds  Findings:                 A few small-mouthed diverticula were found in the                            sigmoid colon.  Non-bleeding internal hemorrhoids were found during                            retroflexion. The hemorrhoids were moderate.                           The exam was otherwise without abnormality. Complications:            No immediate complications. Estimated Blood Loss:     Estimated blood loss: none. Impression:               - Mild sigmoid diverticulosis                           - Non-bleeding internal hemorrhoids (likely                            etiology of rectal bleeding).                           - Otherwise normal colonoscopy to IC valve. Cecum                            visualized but not intubated. Recommendation:           - Patient has a contact number available for                            emergencies. The signs and symptoms of potential                            delayed complications were discussed with the                            patient. Return to normal activities tomorrow.                            Written discharge  instructions were provided to the                            patient.                           - Resume previous diet.                           - Continue present medications.                           - Resume Plavix (clopidogrel) at prior dose today.                           - Discussed virtual colonoscopy. Due to patient's                            increased BMI, it would be challenging. She would  like to hold off on that at the present time. We                            will discuss more at follow-up visit.                           - Repeat colonoscopy in 5 years for screening                            purposes d/t family history.                           - HC 2.5% 1 twice daily PR x 10 to 14 days as                            needed. 2 refills.                           - Return to GI clinic in 12 weeks.                           - D/W Mathis Fare. Lynann Bologna, MD 12/13/2019 9:31:24 AM This report has been signed electronically.

## 2019-12-13 NOTE — Patient Instructions (Signed)
HANDOUTS PROVIDED ON: DIVERTICULOSIS & HEMORRHOIDS    You may resume your previous diet and medication schedule.  You may resume your Plavix (Clopidogrel) today as well.  Your next colonoscopy should occur in 5 years for screening.   Begin using the Hydrocortisone cream ordered as directed.  Thank you for allowing [place] to care for you today!!!  YOU HAD AN ENDOSCOPIC PROCEDURE TODAY AT THE [facility]:   Refer to the procedure report that was given to you for any specific questions about what was found during the examination.  If the procedure report does not answer your questions, please call your gastroenterologist to clarify.  If you requested that your care partner not be given the details of your procedure findings, then the procedure report has been included in a sealed envelope for you to review at your convenience later.  YOU SHOULD EXPECT: Some feelings of bloating in the abdomen. Passage of more gas than usual.  Walking can help get rid of the air that was put into your GI tract during the procedure and reduce the bloating. If you had a lower endoscopy (such as a colonoscopy or flexible sigmoidoscopy) you may notice spotting of blood in your stool or on the toilet paper. If you underwent a bowel prep for your procedure, you may not have a normal bowel movement for a few days.  Please Note:  You might notice some irritation and congestion in your nose or some drainage.  This is from the oxygen used during your procedure.  There is no need for concern and it should clear up in a day or so.  SYMPTOMS TO REPORT IMMEDIATELY:   Following lower endoscopy (colonoscopy or flexible sigmoidoscopy):  Excessive amounts of blood in the stool  Significant tenderness or worsening of abdominal pains  Swelling of the abdomen that is new, acute  Fever of 100F or higher  For urgent or emergent issues, a gastroenterologist can be reached at any hour by calling [phone].   DIET:  We  do recommend a small meal at first, but then you may proceed to your regular diet.  Drink plenty of fluids but you should avoid alcoholic beverages for 24 hours.  ACTIVITY:  You should plan to take it easy for the rest of today and you should NOT DRIVE or use heavy machinery until tomorrow (because of the sedation medicines used during the test).    FOLLOW UP: Our staff will call the number listed on your records 48-72 hours following your procedure to check on you and address any questions or concerns that you may have regarding the information given to you following your procedure. If we do not reach you, we will leave a message.  We will attempt to reach you two times.  During this call, we will ask if you have developed any symptoms of COVID 19. If you develop any symptoms (ie: fever, flu-like symptoms, shortness of breath, cough etc.) before then, please call [phone].  If you test positive for Covid 19 in the 2 weeks post procedure, please call and report this information to [place].    If any biopsies were taken you will be contacted by phone or by letter within the next 1-3 weeks.  Please call us at [phone] if you have not heard about the biopsies in 3 weeks.    SIGNATURES/CONFIDENTIALITY: You and/or your care partner have signed paperwork which will be entered into your electronic medical record.  These signatures attest to the  fact that that the information above on your After Visit Summary has been reviewed and is understood.  Full responsibility of the confidentiality of this discharge information lies with you and/or your care-partner.

## 2019-12-13 NOTE — Progress Notes (Signed)
PT taken to PACU. Monitors in place. VSS. Report given to RN. 

## 2019-12-17 ENCOUNTER — Telehealth: Payer: Self-pay

## 2019-12-17 NOTE — Telephone Encounter (Signed)
  Follow up Call-  Call back number 12/13/2019  Post procedure Call Back phone  # [phone]  Permission to leave phone message Yes     Patient questions:  Do you have a fever, pain , or abdominal swelling? No. Pain Score  0 *  Have you tolerated food without any problems? Yes.    Have you been able to return to your normal activities? Yes.    Do you have any questions about your discharge instructions: Diet   No. Medications  No. Follow up visit  No.  Do you have questions or concerns about your Care? No.  Actions: * If pain score is 4 or above: 1. No action needed, pain <4.Have you developed a fever since your procedure? no  2.   Have you had an respiratory symptoms (SOB or cough) since your procedure? no  3.   Have you tested positive for COVID 19 since your procedure no  4.   Have you had any family members/close contacts diagnosed with the COVID 19 since your procedure?  no   If yes to any of these questions please route to Laverna Peace, RN and Jennye Boroughs, Charity fundraiser.

## 2020-01-31 DIAGNOSIS — G4733 Obstructive sleep apnea (adult) (pediatric): Secondary | ICD-10-CM | POA: Diagnosis not present

## 2020-02-13 ENCOUNTER — Other Ambulatory Visit: Payer: Self-pay | Admitting: Family

## 2020-02-17 IMAGING — MG DIGITAL SCREENING BILATERAL MAMMOGRAM WITH TOMO AND CAD
8 of 16 series · 8 of 40 positions shown · non-contrast
Comparison: None.

ACR Breast Density Category a: The breast tissue is almost entirely
fatty.

CLINICAL DATA: Screening.

EXAM:
DIGITAL SCREENING BILATERAL MAMMOGRAM WITH TOMO AND CAD

[L CC synth-2D (1 of 2)]
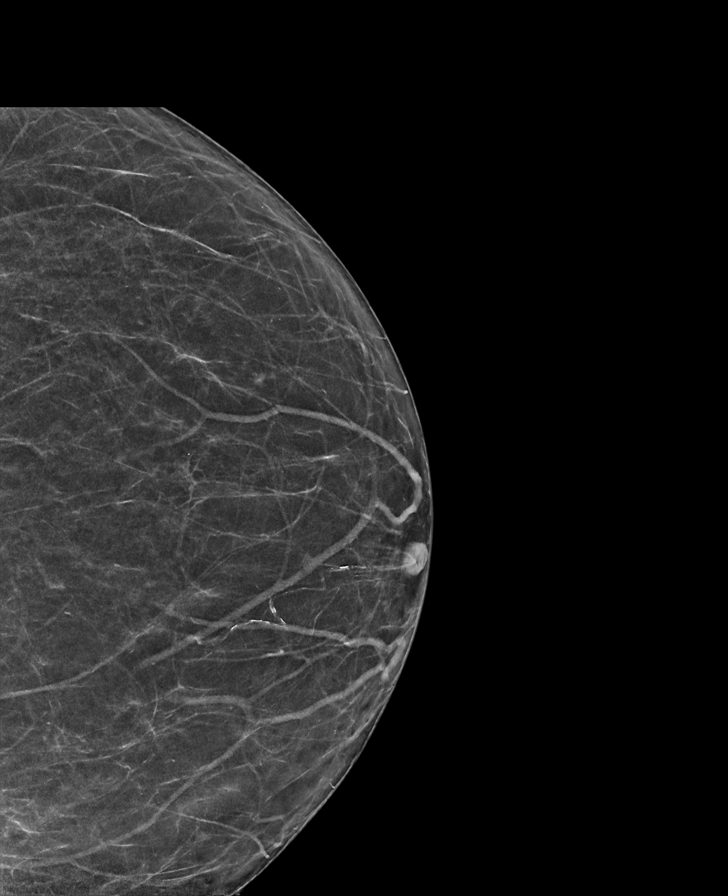

[L MLO synth-2D (1 of 2)]
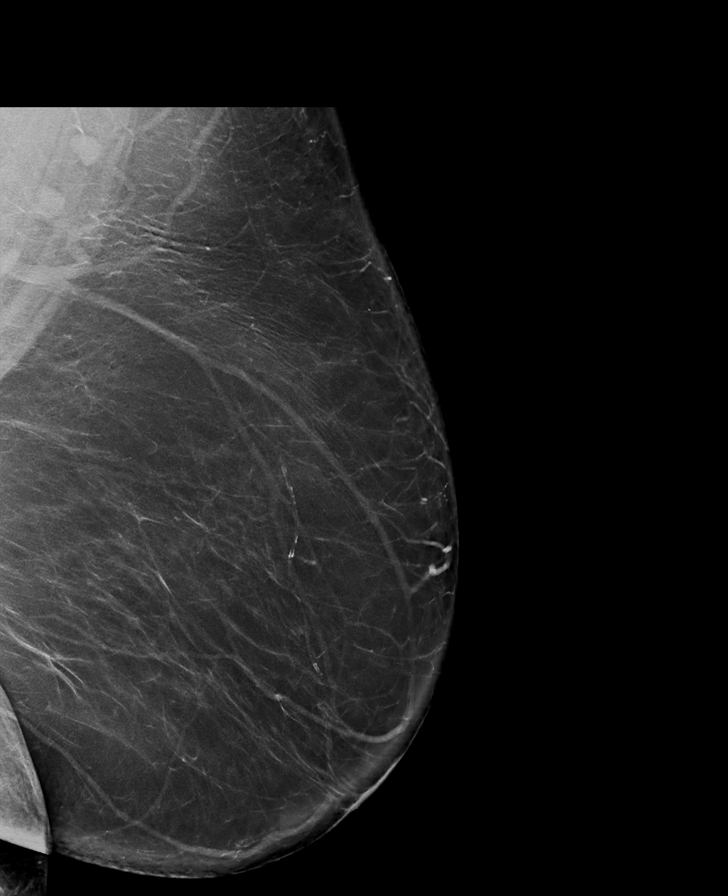

[L MLO synth-2D (2 of 2)]
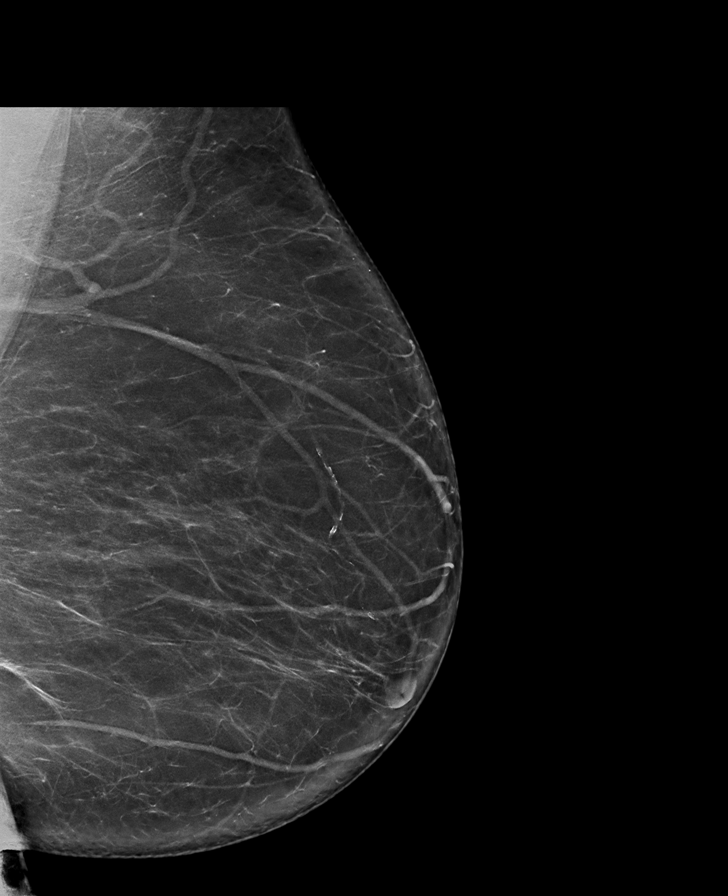

[R MLO synth-2D]
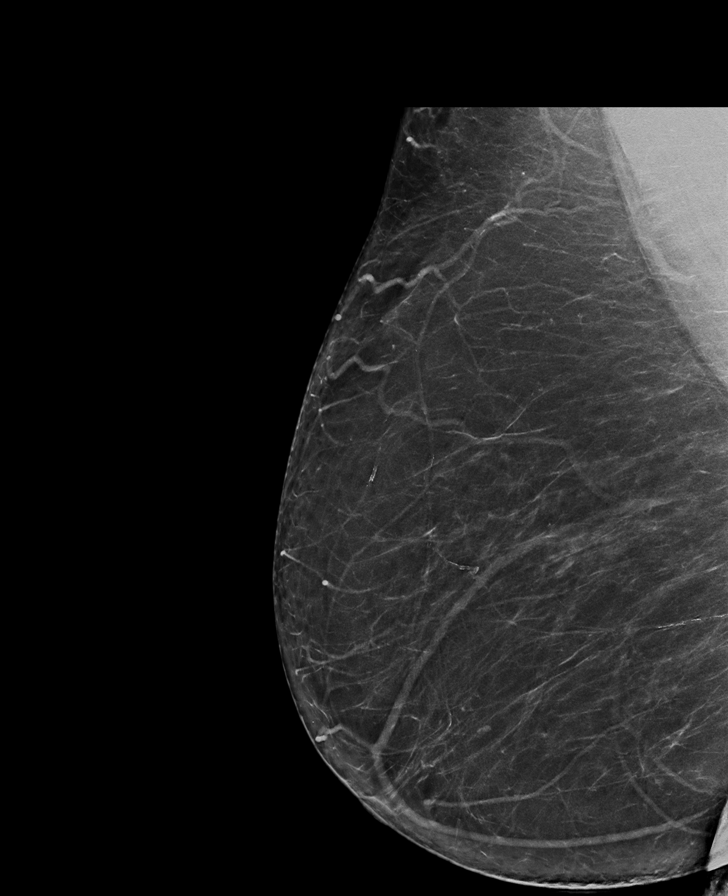

[L CC synth-2D (2 of 2)]
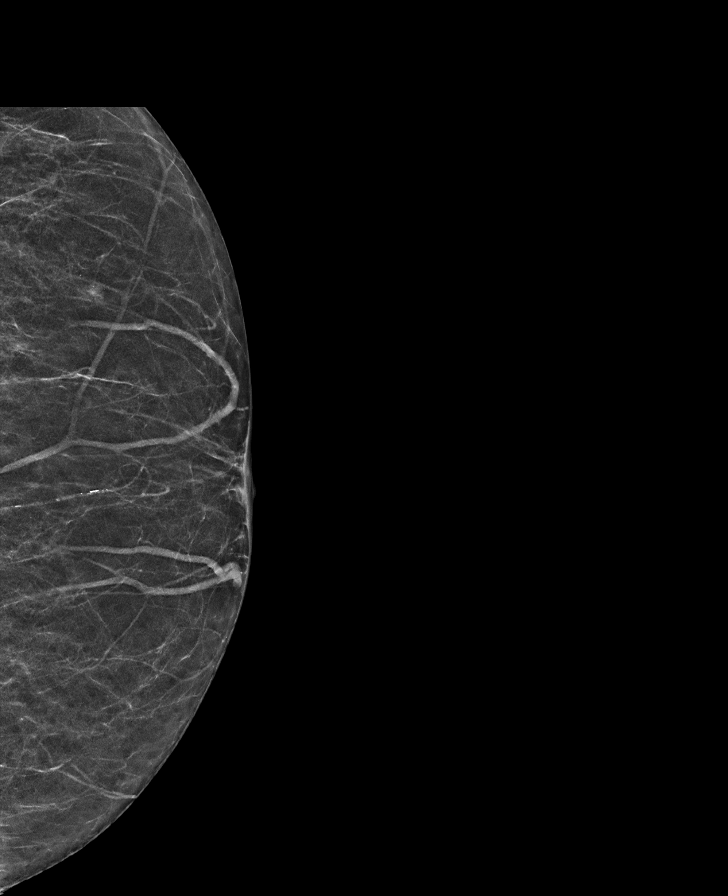

[R CC synth-2D]
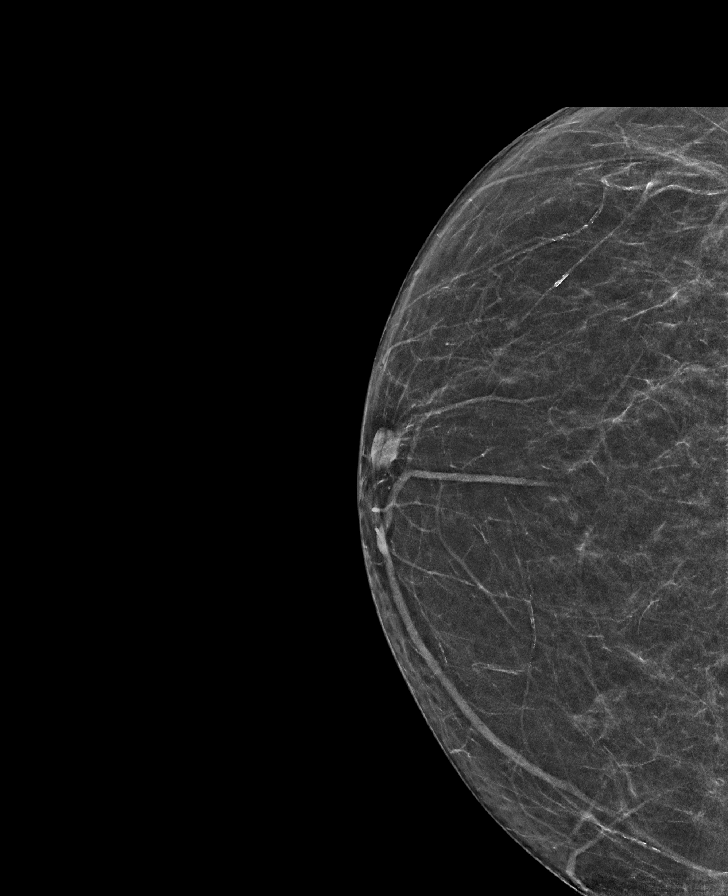

[R CV synth-2D]
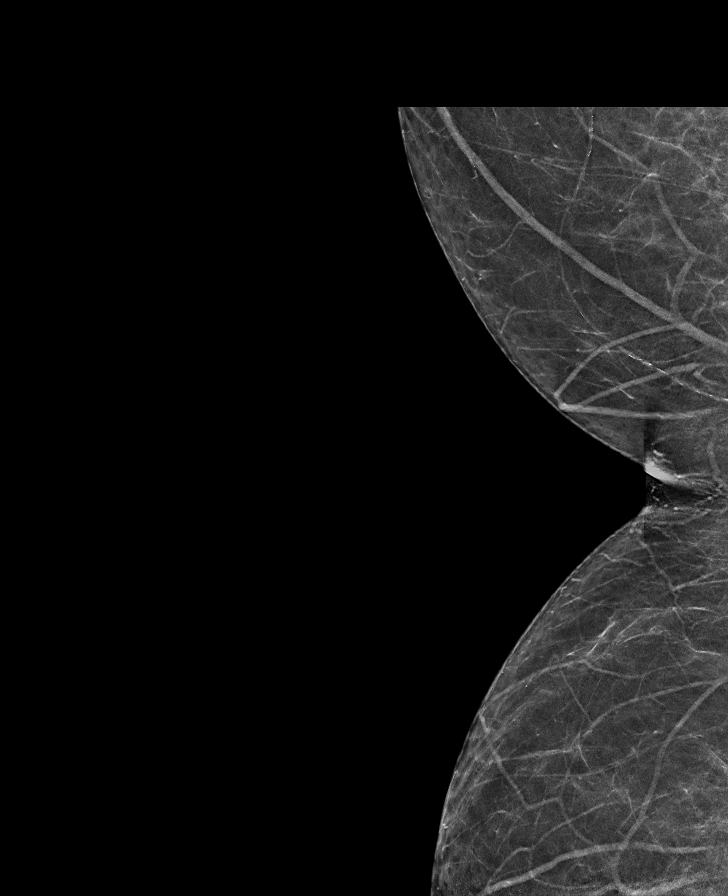

[R CV tomo · tomo slice 35/69.0]
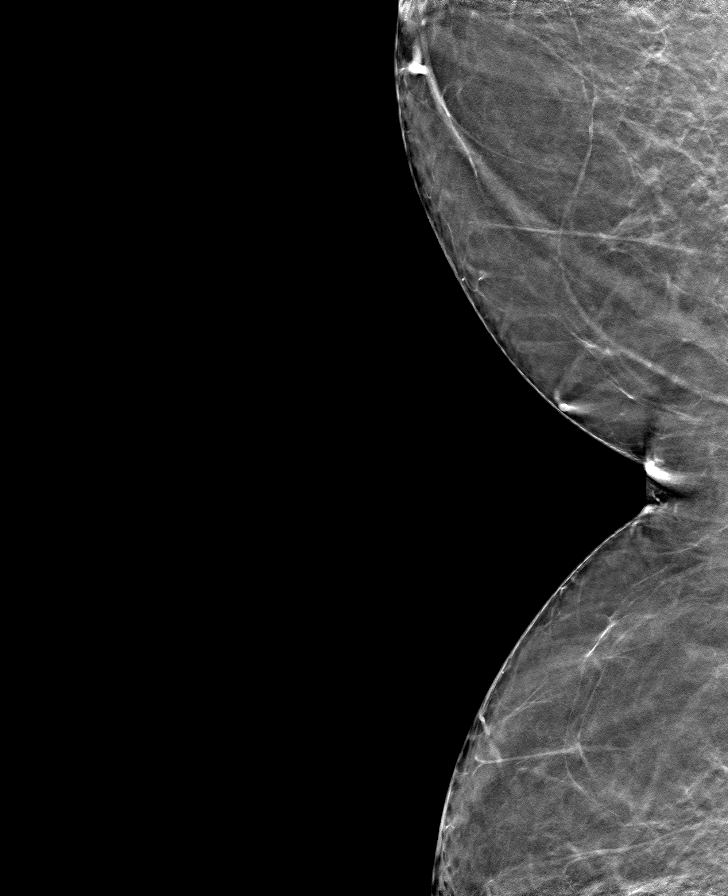

[8 of 40 positions shown; findings below may reference images not displayed]

FINDINGS: There are no findings suspicious for malignancy. Images were
processed with CAD.
IMPRESSION: No mammographic evidence of malignancy. A result letter of this
screening mammogram will be mailed directly to the patient.

RECOMMENDATION:
Screening mammogram in one year. (Code:[id number])

BI-RADS CATEGORY  1: Negative.

## 2020-02-18 DIAGNOSIS — H40053 Ocular hypertension, bilateral: Secondary | ICD-10-CM | POA: Diagnosis not present

## 2020-02-18 DIAGNOSIS — H40029 Open angle with borderline findings, high risk, unspecified eye: Secondary | ICD-10-CM | POA: Diagnosis not present

## 2020-02-20 ENCOUNTER — Other Ambulatory Visit: Payer: Self-pay | Admitting: Family

## 2020-02-20 DIAGNOSIS — G4733 Obstructive sleep apnea (adult) (pediatric): Secondary | ICD-10-CM | POA: Diagnosis not present

## 2020-03-04 DIAGNOSIS — H40053 Ocular hypertension, bilateral: Secondary | ICD-10-CM | POA: Diagnosis not present

## 2020-03-04 DIAGNOSIS — H25813 Combined forms of age-related cataract, bilateral: Secondary | ICD-10-CM | POA: Diagnosis not present

## 2020-03-06 DIAGNOSIS — G4733 Obstructive sleep apnea (adult) (pediatric): Secondary | ICD-10-CM | POA: Diagnosis not present

## 2020-03-11 DIAGNOSIS — G8929 Other chronic pain: Secondary | ICD-10-CM | POA: Diagnosis not present

## 2020-03-11 DIAGNOSIS — I2510 Atherosclerotic heart disease of native coronary artery without angina pectoris: Secondary | ICD-10-CM | POA: Diagnosis not present

## 2020-03-11 DIAGNOSIS — K219 Gastro-esophageal reflux disease without esophagitis: Secondary | ICD-10-CM | POA: Diagnosis not present

## 2020-03-11 DIAGNOSIS — E119 Type 2 diabetes mellitus without complications: Secondary | ICD-10-CM | POA: Diagnosis not present

## 2020-03-11 DIAGNOSIS — E559 Vitamin D deficiency, unspecified: Secondary | ICD-10-CM | POA: Diagnosis not present

## 2020-03-22 DIAGNOSIS — G4733 Obstructive sleep apnea (adult) (pediatric): Secondary | ICD-10-CM | POA: Diagnosis not present

## 2020-04-09 ENCOUNTER — Other Ambulatory Visit: Payer: Self-pay | Admitting: Family

## 2020-04-17 ENCOUNTER — Other Ambulatory Visit: Payer: Self-pay

## 2020-04-17 ENCOUNTER — Ambulatory Visit (INDEPENDENT_AMBULATORY_CARE_PROVIDER_SITE_OTHER): Payer: Medicare Other | Admitting: Cardiology

## 2020-04-17 ENCOUNTER — Encounter: Payer: Self-pay | Admitting: Cardiology

## 2020-04-17 VITALS — BP 122/76 | HR 82 | Ht 65.0 in | Wt 300.0 lb

## 2020-04-17 DIAGNOSIS — G4733 Obstructive sleep apnea (adult) (pediatric): Secondary | ICD-10-CM

## 2020-04-17 DIAGNOSIS — I2510 Atherosclerotic heart disease of native coronary artery without angina pectoris: Secondary | ICD-10-CM

## 2020-04-17 DIAGNOSIS — I10 Essential (primary) hypertension: Secondary | ICD-10-CM

## 2020-04-17 MED ORDER — EZETIMIBE 10 MG PO TABS: 10.0000 mg | ORAL_TABLET | Freq: Every day | ORAL | 0 refills | Status: DC

## 2020-04-17 NOTE — Patient Instructions (Signed)

## 2020-04-17 NOTE — Progress Notes (Signed)
Cardiology Office Note:    Date:  04/17/2020   ID:  Kristina Singleton, DOB Sep 14, 1951, MRN [id number]  PCP:  Leane Call, PA-C  Cardiologist:  Gypsy Balsam, MD    Referring MD: Leane Call, PA-C   Chief Complaint  Patient presents with  . Follow-up  Doing fine  History of Present Illness:    Kristina Singleton is a 69 y.o. female with past medical history significant for coronary artery disease, she did have PTCA and stenting done of unknown vessel that was done in [place] in 2003, she does have obstructive sleep apnea, diabetes, essential hypertension.  Comes today to my office for follow-up.  Overall seems to be doing well.  Denies have any chest pain tightness squeezing pressure burning chest no palpitations no dizziness.  She does not exercise on the regular basis but she just purchased stationary bike and she is trying to start doing exercises.  Past Medical History:  Diagnosis Date  . Arthritis   . CAD (coronary artery disease)    with stent placement  . COPD (chronic obstructive pulmonary disease) (HCC)   . Diabetes mellitus without complication (HCC)    doesn't have  . Family history of colon cancer   . Fibromyalgia   . GERD (gastroesophageal reflux disease)   . Hyperlipidemia   . Hypertension   . Lower back pain    due to OA  . Obesity   . Personal history of colonic polyps   . Sleep apnea     Past Surgical History:  Procedure Laterality Date  . ARTHRODESIS    . CARDIAC CATHETERIZATION    . CESAREAN SECTION    . CHOLECYSTECTOMY    . CHOLECYSTECTOMY, LAPAROSCOPIC    . COLONOSCOPY  09/17/2014   Colonic polyps status postr polypectomy. Mild sigmoid diverticulosis. Limited examination of the right side of the colon due to the quality of preparation.   . CORONARY ANGIOPLASTY    . ESOPHAGOGASTRODUODENOSCOPY  07/17/2013   Small hiatal hernia. Healed esophageal erosions (biopsied)   . FOOT SURGERY Left 2017   and in 2018 had to restructue  it again  . HERNIA REPAIR  2012  . HYSTEROSCOPY    . 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 REPLACEMENT TOTAL KNEE  2016  . SHOULDER ARTHROSCOPY      Current Medications: Current Meds  Medication Sig  . albuterol (PROAIR HFA) 108 (90 Base) MCG/ACT inhaler Inhale 2 puffs into the lungs every 6 (six) hours as needed.  . AMBULATORY NON FORMULARY MEDICATION 2 (two) times daily. CBD oil  . atorvastatin (LIPITOR) 40 MG tablet Take 1 tablet (40 mg total) by mouth daily.  . cetirizine (ZYRTEC) 10 MG tablet Take 1 mg by mouth daily.  . cloNIDine (CATAPRES) 0.3 MG tablet Take 1 tablet by mouth daily.  . clopidogrel (PLAVIX) 75 MG tablet Take 1 tablet by mouth daily.  . cyclobenzaprine (FLEXERIL) 10 MG tablet Take 1 tablet by mouth 2 (two) times daily.   Marland Kitchen esomeprazole (NEXIUM) 40 MG capsule Take 1 capsule by mouth 2 (two) times daily.  Marland Kitchen ezetimibe (ZETIA) 10 MG tablet Take 1 tablet (10 mg total) by mouth daily.  . fluticasone (FLONASE) 50 MCG/ACT nasal spray Place 2 sprays into both nostrils 2 (two) times daily.  . furosemide (LASIX) 40 MG tablet Take 1 tablet by mouth as needed.   . hydrALAZINE (APRESOLINE) 25 MG tablet Take 25 mg by mouth 3 (three) times daily.  Marland Kitchen  hydrochlorothiazide (HYDRODIURIL) 25 MG tablet Take 25 mg by mouth daily.  . hydrocortisone (ANUSOL-HC) 2.5 % rectal cream Place 1 application rectally 2 (two) times daily as needed for hemorrhoids or anal itching.  Marland Kitchen NAPROXEN PO Take 2 tablets by mouth as needed.  . nitroGLYCERIN (NITROSTAT) 0.4 MG SL tablet Place 1 tablet (0.4 mg total) under the tongue as needed for chest pain.  Marland Kitchen olmesartan (BENICAR) 40 MG tablet Take 40 mg by mouth daily.  Marland Kitchen olopatadine (PATANOL) 0.1 % ophthalmic solution Place 2 drops into both eyes as needed.  . potassium chloride SA (KLOR-CON) 20 MEQ tablet take two tablets BY MOUTH TWICE DAILY  . sucralfate (CARAFATE) 1 g tablet Take 1 g by mouth 2 (two) times daily.  . Vitamin D,  Ergocalciferol, (DRISDOL) 1.25 MG (50000 UNIT) CAPS capsule Take 50,000 Units by mouth once a week.     Allergies:   Azithromycin, Celecoxib, Morphine, Duloxetine, Erythromycin base, and Lisinopril   Social History   Socioeconomic History  . Marital status: Married    Spouse name: Not on file  . Number of children: 2  . Years of education: Not on file  . Highest education level: Not on file  Occupational History  . Occupation: Retired   Tobacco Use  . Smoking status: Former Smoker    Quit date: 1989    Years since quitting: 32.4  . Smokeless tobacco: Never Used  Substance and Sexual Activity  . Alcohol use: Yes    Alcohol/week: 2.0 standard drinks    Types: 2 Glasses of wine per week  . Drug use: Never  . Sexual activity: Not on file  Other Topics Concern  . Not on file  Social History Narrative  . Not on file   Social Determinants of Health   Financial Resource Strain:   . Difficulty of Paying Living Expenses:   Food Insecurity:   . Worried About Programme researcher, broadcasting/film/video in the Last Year:   . Barista in the Last Year:   Transportation Needs:   . Freight forwarder (Medical):   Marland Kitchen Lack of Transportation (Non-Medical):   Physical Activity:   . Days of Exercise per Week:   . Minutes of Exercise per Session:   Stress:   . Feeling of Stress :   Social Connections:   . Frequency of Communication with Friends and Family:   . Frequency of Social Gatherings with Friends and Family:   . Attends Religious Services:   . Active Member of Clubs or Organizations:   . Attends Banker Meetings:   Marland Kitchen Marital Status:      Family History: The patient's family history includes Arthritis in her daughter; CAD in her mother; Colon cancer in her sister; Heart attack in her father; Heart disease in her mother; Hypertension in her father and mother. There is no history of Breast cancer, Esophageal cancer, Stomach cancer, or Rectal cancer. ROS:   Please see the  history of present illness.    All 14 point review of systems negative except as described per history of present illness  EKGs/Labs/Other Studies Reviewed:      Recent Labs: No results found for requested labs within last 8760 hours.  Recent Lipid Panel    Component Value Date/Time   CHOL 165 02/06/2019 0854   TRIG 134 02/06/2019 0854   HDL 70 02/06/2019 0854   CHOLHDL 2.4 02/06/2019 0854   LDLCALC 68 02/06/2019 0854    Physical Exam:  VS:  BP 122/76   Pulse 82   Ht 5\' 5"  (3.557 m)   Wt 300 lb (136.1 kg)   SpO2 99%   BMI 49.92 kg/m     Wt Readings from Last 3 Encounters:  04/17/20 300 lb (136.1 kg)  12/13/19 (!) 302 lb (137 kg)  12/03/19 (!) 302 lb 4 oz (137.1 kg)     GEN:  Well nourished, well developed in no acute distress HEENT: Normal NECK: No JVD; No carotid bruits LYMPHATICS: No lymphadenopathy CARDIAC: RRR, no murmurs, no rubs, no gallops RESPIRATORY:  Clear to auscultation without rales, wheezing or rhonchi  ABDOMEN: Soft, non-tender, non-distended MUSCULOSKELETAL:  No edema; No deformity  SKIN: Warm and dry LOWER EXTREMITIES: no swelling NEUROLOGIC:  Alert and oriented x 3 PSYCHIATRIC:  Normal affect   ASSESSMENT:    1. Coronary artery disease involving native coronary artery of native heart without angina pectoris   2. OSA (obstructive sleep apnea)   3. Essential hypertension   4. Morbid obesity ([place])    PLAN:    In order of problems listed above:  1. Coronary disease: Stable from that point review.  Asymptomatic.  On appropriate medications.  She is on Plavix because she does have some intolerance to aspirin.  Denies having any typical symptoms. 2. Obstructive sleep apnea: She does use CPAP mask and she is very happy with the effect of it. 3. Essential hypertension blood pressure well controlled continue present management. 4. Morbid obesity: Obviously a problem she weighed 300 pounds.  We talked about need to lose weight she understands she  will try to do it.  Encouraging is the fact that she purchased a stationary bike and hopefully she will start exercising on a regular basis. 5. Dyslipidemia: I will call primary care physician to get her fasting lipid profile.  She is on Zetia as well as Lipitor which I will continue for now.   Medication Adjustments/Labs and Tests Ordered: Current medicines are reviewed at length with the patient today.  Concerns regarding medicines are outlined above.  No orders of the defined types were placed in this encounter.  Medication changes: No orders of the defined types were placed in this encounter.   Signed, Park Liter, MD, [facility] 04/17/2020 1:53 PM    [place]

## 2020-04-21 DIAGNOSIS — G4733 Obstructive sleep apnea (adult) (pediatric): Secondary | ICD-10-CM | POA: Diagnosis not present

## 2020-04-28 DIAGNOSIS — G4733 Obstructive sleep apnea (adult) (pediatric): Secondary | ICD-10-CM | POA: Diagnosis not present

## 2020-05-22 DIAGNOSIS — G4733 Obstructive sleep apnea (adult) (pediatric): Secondary | ICD-10-CM | POA: Diagnosis not present

## 2020-05-25 ENCOUNTER — Other Ambulatory Visit: Payer: Self-pay | Admitting: Family

## 2020-06-21 DIAGNOSIS — G4733 Obstructive sleep apnea (adult) (pediatric): Secondary | ICD-10-CM | POA: Diagnosis not present

## 2020-07-22 DIAGNOSIS — G4733 Obstructive sleep apnea (adult) (pediatric): Secondary | ICD-10-CM | POA: Diagnosis not present

## 2020-07-27 DIAGNOSIS — G4733 Obstructive sleep apnea (adult) (pediatric): Secondary | ICD-10-CM | POA: Diagnosis not present

## 2020-08-22 DIAGNOSIS — G4733 Obstructive sleep apnea (adult) (pediatric): Secondary | ICD-10-CM | POA: Diagnosis not present

## 2020-08-24 ENCOUNTER — Other Ambulatory Visit: Payer: Self-pay | Admitting: Cardiology

## 2020-09-10 DIAGNOSIS — I2510 Atherosclerotic heart disease of native coronary artery without angina pectoris: Secondary | ICD-10-CM | POA: Diagnosis not present

## 2020-09-10 DIAGNOSIS — E559 Vitamin D deficiency, unspecified: Secondary | ICD-10-CM | POA: Diagnosis not present

## 2020-09-10 DIAGNOSIS — E119 Type 2 diabetes mellitus without complications: Secondary | ICD-10-CM | POA: Diagnosis not present

## 2020-09-10 DIAGNOSIS — K219 Gastro-esophageal reflux disease without esophagitis: Secondary | ICD-10-CM | POA: Diagnosis not present

## 2020-09-21 DIAGNOSIS — G4733 Obstructive sleep apnea (adult) (pediatric): Secondary | ICD-10-CM | POA: Diagnosis not present

## 2020-10-03 ENCOUNTER — Other Ambulatory Visit: Payer: Self-pay | Admitting: Cardiology

## 2020-10-16 ENCOUNTER — Other Ambulatory Visit: Payer: Self-pay

## 2020-10-16 DIAGNOSIS — M545 Low back pain, unspecified: Secondary | ICD-10-CM | POA: Insufficient documentation

## 2020-10-16 DIAGNOSIS — K219 Gastro-esophageal reflux disease without esophagitis: Secondary | ICD-10-CM | POA: Insufficient documentation

## 2020-10-16 DIAGNOSIS — M797 Fibromyalgia: Secondary | ICD-10-CM | POA: Insufficient documentation

## 2020-10-16 DIAGNOSIS — Z860100 Personal history of colon polyps, unspecified: Secondary | ICD-10-CM | POA: Insufficient documentation

## 2020-10-16 DIAGNOSIS — E785 Hyperlipidemia, unspecified: Secondary | ICD-10-CM | POA: Insufficient documentation

## 2020-10-16 DIAGNOSIS — Z800 Family history of malignant neoplasm of digestive organs: Secondary | ICD-10-CM | POA: Insufficient documentation

## 2020-10-16 DIAGNOSIS — E669 Obesity, unspecified: Secondary | ICD-10-CM | POA: Insufficient documentation

## 2020-10-16 DIAGNOSIS — G8929 Other chronic pain: Secondary | ICD-10-CM | POA: Insufficient documentation

## 2020-10-16 DIAGNOSIS — G4730 Sleep apnea, unspecified: Secondary | ICD-10-CM | POA: Insufficient documentation

## 2020-10-16 DIAGNOSIS — I2510 Atherosclerotic heart disease of native coronary artery without angina pectoris: Secondary | ICD-10-CM | POA: Insufficient documentation

## 2020-10-16 DIAGNOSIS — M1990 Unspecified osteoarthritis, unspecified site: Secondary | ICD-10-CM | POA: Insufficient documentation

## 2020-10-16 DIAGNOSIS — E119 Type 2 diabetes mellitus without complications: Secondary | ICD-10-CM | POA: Insufficient documentation

## 2020-10-19 ENCOUNTER — Ambulatory Visit (INDEPENDENT_AMBULATORY_CARE_PROVIDER_SITE_OTHER): Payer: Medicare Other | Admitting: Cardiology

## 2020-10-19 ENCOUNTER — Other Ambulatory Visit: Payer: Self-pay

## 2020-10-19 ENCOUNTER — Encounter: Payer: Self-pay | Admitting: Cardiology

## 2020-10-19 VITALS — BP 100/60 | HR 81 | Ht 65.0 in | Wt 283.0 lb

## 2020-10-19 DIAGNOSIS — G4733 Obstructive sleep apnea (adult) (pediatric): Secondary | ICD-10-CM

## 2020-10-19 DIAGNOSIS — E7800 Pure hypercholesterolemia, unspecified: Secondary | ICD-10-CM

## 2020-10-19 DIAGNOSIS — E119 Type 2 diabetes mellitus without complications: Secondary | ICD-10-CM

## 2020-10-19 DIAGNOSIS — I2510 Atherosclerotic heart disease of native coronary artery without angina pectoris: Secondary | ICD-10-CM

## 2020-10-19 MED ORDER — CLONIDINE HCL 0.2 MG PO TABS
0.2000 mg | ORAL_TABLET | Freq: Every day | ORAL | 1 refills | Status: DC
Start: 2020-10-19 — End: 2021-04-06

## 2020-10-19 NOTE — Addendum Note (Signed)
Addended by: Hazle Quant on: 10/19/2020 01:43 PM   Modules accepted: Orders

## 2020-10-19 NOTE — Progress Notes (Signed)
Cardiology Office Note:    Date:  10/19/2020   ID:  Kristina MantleLinda Horton Casler, DOB 1951/03/13, MRN [id number]  PCP:  Irena Reichmannollins, Dana, DO  Cardiologist:  Gypsy Balsamobert Samael Blades, MD    Referring MD: Leane CallNodal, Jr Reinaldo, PA-C   Chief Complaint  Patient presents with  . Follow-up  I am doing well but I am weak and tired  History of Present Illness:    Kristina Singleton is a 69 y.o. female with past medical history significant for coronary artery disease in 2003 she did have PTCA and stenting of unknown artery in [place].  Also COPD obstructive sleep apnea essential hypertension dyslipidemia.  Comes today 2 months for follow-up.  Overall doing well but complain of being weak tired and exhausted.  She says she wakes up being tired goes to sleep being tired.  She try to walk around but that bring fatigue and tiredness.  There is also some shortness of breath  Past Medical History:  Diagnosis Date  . Acute right ankle pain 08/16/2018  . Arthritis   . BPPV (benign paroxysmal positional vertigo) 02/01/2016  . CAD (coronary artery disease)    with stent placement  . CAD (coronary artery disease), native coronary artery 02/01/2016   PTCA and stenting of unknown artery in [place] in 2003  . Constipation 11/03/2019  . COPD (chronic obstructive pulmonary disease) (HCC)   . Diabetes mellitus without complication (HCC)    doesn't have  . Dyslipidemia 02/01/2016  . Elevated liver enzymes 08/23/2017  . Family history of colon cancer   . Fibromyalgia   . Gastroesophageal reflux disease without esophagitis 02/01/2016  . GERD (gastroesophageal reflux disease)   . Hypercholesterolemia 08/23/2017  . Hyperlipidemia   . Hypertension   . Hypokalemia 08/23/2017  . Lower back pain    due to OA  . Lower extremity edema 08/23/2017  . Morbid obesity (HCC) 02/01/2016  . Obesity   . Obesity   . Opioid dependence (HCC) 08/23/2017  . OSA (obstructive sleep apnea) 10/28/2019  . Personal history of colonic polyps   . 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 Primary osteoarthritis of right ankle 08/29/2018  . Rectal bleeding 11/03/2019  . Right calf pain 01/07/2019  . Right foot pain 08/16/2018  . Sleep apnea   . Stress fracture of navicular bone of left foot 10/23/2018  . Type 2 diabetes mellitus without complication, without long-term current use of insulin (HCC) 08/23/2017    Past Surgical History:  Procedure Laterality Date  . ARTHRODESIS    . CARDIAC CATHETERIZATION    . CESAREAN SECTION    . CHOLECYSTECTOMY    . CHOLECYSTECTOMY, LAPAROSCOPIC    . COLONOSCOPY  09/17/2014   Colonic polyps status postr polypectomy. Mild sigmoid diverticulosis. Limited examination of the right side of the colon due to the quality of preparation.   . CORONARY ANGIOPLASTY    . ESOPHAGOGASTRODUODENOSCOPY  07/17/2013   Small hiatal hernia. Healed esophageal erosions (biopsied)   . FOOT SURGERY Left 2017   and in 2018 had to restructue it again  . HERNIA REPAIR  2012  . HYSTEROSCOPY    . 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 REPLACEMENT TOTAL KNEE  2016  . SHOULDER ARTHROSCOPY      Current Medications: Current Meds  Medication Sig  . albuterol (PROAIR HFA) 108 (90 Base) MCG/ACT inhaler Inhale 2 puffs into the lungs every 6 (six) hours as needed.  . AMBULATORY NON FORMULARY MEDICATION 2 (two) times  daily. CBD oil  . atorvastatin (LIPITOR) 40 MG tablet Take 1 tablet (40 mg total) by mouth daily.  . cetirizine (ZYRTEC) 10 MG tablet Take 1 mg by mouth daily.  . cloNIDine (CATAPRES) 0.3 MG tablet Take 1 tablet by mouth daily.  . clopidogrel (PLAVIX) 75 MG tablet Take 1 tablet by mouth daily.  . cyclobenzaprine (FLEXERIL) 10 MG tablet Take 1 tablet by mouth 2 (two) times daily.   Marland Kitchen esomeprazole (NEXIUM) 40 MG capsule Take 1 capsule by mouth 2 (two) times daily.  Marland Kitchen ezetimibe (ZETIA) 10 MG tablet Take 1 tablet (10 mg total) by mouth daily.  . fluticasone (FLONASE) 50 MCG/ACT nasal spray Place 2 sprays  into both nostrils 2 (two) times daily.  . furosemide (LASIX) 40 MG tablet Take 1 tablet by mouth as needed.   . hydrALAZINE (APRESOLINE) 25 MG tablet Take 25 mg by mouth 3 (three) times daily.  . hydrochlorothiazide (HYDRODIURIL) 25 MG tablet Take 25 mg by mouth daily.  . hydrocortisone (ANUSOL-HC) 2.5 % rectal cream Place 1 application rectally 2 (two) times daily as needed for hemorrhoids or anal itching.  . levETIRAcetam (KEPPRA) 250 MG tablet Take 1 tablet by mouth at bedtime.   . montelukast (SINGULAIR) 10 MG tablet Take 10 mg by mouth at bedtime.  Marland Kitchen NAPROXEN PO Take 2 tablets by mouth as needed.  . nitroGLYCERIN (NITROSTAT) 0.4 MG SL tablet Place 1 tablet (0.4 mg total) under the tongue as needed for chest pain.  Marland Kitchen olmesartan (BENICAR) 40 MG tablet Take 40 mg by mouth daily.  Marland Kitchen olopatadine (PATANOL) 0.1 % ophthalmic solution Place 2 drops into both eyes as needed.  . potassium chloride SA (KLOR-CON) 20 MEQ tablet take two tablets BY MOUTH TWICE DAILY  . sucralfate (CARAFATE) 1 g tablet Take 1 g by mouth 2 (two) times daily.  . Vitamin D, Ergocalciferol, (DRISDOL) 1.25 MG (50000 UNIT) CAPS capsule Take 50,000 Units by mouth once a week.     Allergies:   Azithromycin, Celecoxib, Morphine, Duloxetine, Erythromycin base, and Lisinopril   Social History   Socioeconomic History  . Marital status: Married    Spouse name: Not on file  . Number of children: 2  . Years of education: Not on file  . Highest education level: Not on file  Occupational History  . Occupation: Retired   Tobacco Use  . Smoking status: Former Smoker    Quit date: 1989    Years since quitting: 32.9  . Smokeless tobacco: Never Used  Vaping Use  . Vaping Use: Some days  Substance and Sexual Activity  . Alcohol use: Yes    Alcohol/week: 2.0 standard drinks    Types: 2 Glasses of wine per week  . Drug use: Never  . Sexual activity: Not on file  Other Topics Concern  . Not on file  Social History Narrative    . Not on file   Social Determinants of Health   Financial Resource Strain:   . Difficulty of Paying Living Expenses: Not on file  Food Insecurity:   . Worried About Programme researcher, broadcasting/film/video in the Last Year: Not on file  . Ran Out of Food in the Last Year: Not on file  Transportation Needs:   . Lack of Transportation (Medical): Not on file  . Lack of Transportation (Non-Medical): Not on file  Physical Activity:   . Days of Exercise per Week: Not on file  . Minutes of Exercise per Session: Not on file  Stress:   .  Feeling of Stress : Not on file  Social Connections:   . Frequency of Communication with Friends and Family: Not on file  . Frequency of Social Gatherings with Friends and Family: Not on file  . Attends Religious Services: Not on file  . Active Member of Clubs or Organizations: Not on file  . Attends Banker Meetings: Not on file  . Marital Status: Not on file     Family History: The patient's family history includes Arthritis in her daughter; CAD in her mother; Colon cancer in her sister; Heart attack in her father; Heart disease in her mother; Hypertension in her father and mother. There is no history of Breast cancer, Esophageal cancer, Stomach cancer, or Rectal cancer. ROS:   Please see the history of present illness.    All 14 point review of systems negative except as described per history of present illness  EKGs/Labs/Other Studies Reviewed:      Recent Labs: No results found for requested labs within last 8760 hours.  Recent Lipid Panel    Component Value Date/Time   CHOL 165 02/06/2019 0854   TRIG 134 02/06/2019 0854   HDL 70 02/06/2019 0854   CHOLHDL 2.4 02/06/2019 0854   LDLCALC 68 02/06/2019 0854    Physical Exam:    VS:  BP 100/60 (BP Location: Left Arm, Patient Position: Sitting, Cuff Size: Large)   Pulse 81   Ht 5\' 5"  (1.651 m)   Wt 283 lb (128.4 kg)   SpO2 97%   BMI 47.09 kg/m     Wt Readings from Last 3 Encounters:   10/19/20 283 lb (128.4 kg)  04/17/20 300 lb (136.1 kg)  12/13/19 (!) 302 lb (137 kg)     GEN:  Well nourished, well developed in no acute distress HEENT: Normal NECK: No JVD; No carotid bruits LYMPHATICS: No lymphadenopathy CARDIAC: RRR, no murmurs, no rubs, no gallops RESPIRATORY:  Clear to auscultation without rales, wheezing or rhonchi  ABDOMEN: Soft, non-tender, non-distended MUSCULOSKELETAL:  No edema; No deformity  SKIN: Warm and dry LOWER EXTREMITIES: no swelling NEUROLOGIC:  Alert and oriented x 3 PSYCHIATRIC:  Normal affect   ASSESSMENT:    1. Coronary artery disease involving native coronary artery of native heart without angina pectoris   2. OSA (obstructive sleep apnea)   3. Type 2 diabetes mellitus without complication, without long-term current use of insulin (HCC)   4. Hypercholesterolemia    PLAN:    In order of problems listed above:  1. Coronary disease stable from that point review denies have any symptoms that would suggest reactivation of the problem.  On appropriate medication which include Plavix.  She is not on aspirin secondary to allergy. 2. Obstructive sleep apnea she does use CPAP mask on the regular basis but she cannot tell me if it helps or not. 3. Type 2 diabetes followed by internal medicine team.  I did review K PN which showed me data from 09/10/2020 with a hemoglobin A1c 6.5 that is a good number we will continue present management. 4. Fatigue and tiredness I will schedule her to have an echocardiogram to assess left ventricle ejection fraction, more so what about her blood pressure potential being too low I asked her to reduce clonidine from 0.3 mg at evening time 0.2 at evening time. 5. Essential hypertension morbidly with opposite problem blood pressure being too low, will lower clonidine from 0.3   To  0.2 a day.   Medication Adjustments/Labs and Tests Ordered: Current  medicines are reviewed at length with the patient today.  Concerns  regarding medicines are outlined above.  No orders of the defined types were placed in this encounter.  Medication changes: No orders of the defined types were placed in this encounter.   Signed, Georgeanna Lea, MD, [facility] 10/19/2020 1:29 PM    [facility]

## 2020-10-19 NOTE — Patient Instructions (Signed)
Medication Instructions:  Your physician has recommended you make the following change in your medication:   DECREASE: Clonidine to 0.2 mg daily   *If you need a refill on your cardiac medications before your next appointment, please call your pharmacy*   Lab Work: None If you have labs (blood work) drawn today and your tests are completely normal, you will receive your results only by: Marland Kitchen MyChart Message (if you have MyChart) OR . A paper copy in the mail If you have any lab test that is abnormal or we need to change your treatment, we will call you to review the results.   Testing/Procedures: Your physician has requested that you have an echocardiogram. Echocardiography is a painless test that uses sound waves to create images of your heart. It provides your doctor with information about the size and shape of your heart and how well your heart's chambers and valves are working. This procedure takes approximately one hour. There are no restrictions for this procedure.     Follow-Up: At [facility], you and your health needs are our priority.  As part of our continuing mission to provide you with exceptional heart care, we have created designated Provider Care Teams.  These Care Teams include your primary Cardiologist (physician) and Advanced Practice Providers (APPs -  Physician Assistants and Nurse Practitioners) who all work together to provide you with the care you need, when you need it.  We recommend signing up for the patient portal called "MyChart".  Sign up information is provided on this After Visit Summary.  MyChart is used to connect with patients for Virtual Visits (Telemedicine).  Patients are able to view lab/test results, encounter notes, upcoming appointments, etc.  Non-urgent messages can be sent to your provider as well.   To learn more about what you can do with MyChart, go to [email or web address].    Your next appointment:   5 month(s)  The format for your  next appointment:   In Person  Provider:   Gypsy Balsam, MD   Other Instructions   Echocardiogram An echocardiogram is a procedure that uses painless sound waves (ultrasound) to produce an image of the heart. Images from an echocardiogram can provide important information about:  Signs of coronary artery disease (CAD).  Aneurysm detection. An aneurysm is a weak or damaged part of an artery wall that bulges out from the normal force of blood pumping through the body.  Heart size and shape. Changes in the size or shape of the heart can be associated with certain conditions, including heart failure, aneurysm, and CAD.  Heart muscle function.  Heart valve function.  Signs of a past heart attack.  Fluid buildup around the heart.  Thickening of the heart muscle.  A tumor or infectious growth around the heart valves. Tell a health care provider about:  Any allergies you have.  All medicines you are taking, including vitamins, herbs, eye drops, creams, and over-the-counter medicines.  Any blood disorders you have.  Any surgeries you have had.  Any medical conditions you have.  Whether you are pregnant or may be pregnant. What are the risks? Generally, this is a safe procedure. However, problems may occur, including:  Allergic reaction to dye (contrast) that may be used during the procedure. What happens before the procedure? No specific preparation is needed. You may eat and drink normally. What happens during the procedure?   An IV tube may be inserted into one of your veins.  You may receive contrast through  this tube. A contrast is an injection that improves the quality of the pictures from your heart.  A gel will be applied to your chest.  A wand-like tool (transducer) will be moved over your chest. The gel will help to transmit the sound waves from the transducer.  The sound waves will harmlessly bounce off of your heart to allow the heart images to be  captured in real-time motion. The images will be recorded on a computer. The procedure may vary among health care providers and hospitals. What happens after the procedure?  You may return to your normal, everyday life, including diet, activities, and medicines, unless your health care provider tells you not to do that. Summary  An echocardiogram is a procedure that uses painless sound waves (ultrasound) to produce an image of the heart.  Images from an echocardiogram can provide important information about the size and shape of your heart, heart muscle function, heart valve function, and fluid buildup around your heart.  You do not need to do anything to prepare before this procedure. You may eat and drink normally.  After the echocardiogram is completed, you may return to your normal, everyday life, unless your health care provider tells you not to do that. This information is not intended to replace advice given to you by your health care provider. Make sure you discuss any questions you have with your health care provider. Document Revised: 03/07/2019 Document Reviewed: 12/17/2016 Elsevier Patient Education  [employer].

## 2020-10-22 DIAGNOSIS — G4733 Obstructive sleep apnea (adult) (pediatric): Secondary | ICD-10-CM | POA: Diagnosis not present

## 2020-10-26 DIAGNOSIS — G4733 Obstructive sleep apnea (adult) (pediatric): Secondary | ICD-10-CM | POA: Diagnosis not present

## 2020-10-30 DIAGNOSIS — Z1231 Encounter for screening mammogram for malignant neoplasm of breast: Secondary | ICD-10-CM | POA: Diagnosis not present

## 2020-11-11 ENCOUNTER — Other Ambulatory Visit: Payer: Self-pay

## 2020-11-11 ENCOUNTER — Ambulatory Visit (HOSPITAL_BASED_OUTPATIENT_CLINIC_OR_DEPARTMENT_OTHER)
Admission: RE | Admit: 2020-11-11 | Discharge: 2020-11-11 | Disposition: A | Discharge status: Home / Self Care | Payer: Medicare Other | Source: Ambulatory Visit | Attending: Cardiology | Admitting: Cardiology

## 2020-11-11 DIAGNOSIS — I2510 Atherosclerotic heart disease of native coronary artery without angina pectoris: Secondary | ICD-10-CM | POA: Diagnosis not present

## 2020-11-11 DIAGNOSIS — E119 Type 2 diabetes mellitus without complications: Secondary | ICD-10-CM | POA: Insufficient documentation

## 2020-11-11 DIAGNOSIS — G4733 Obstructive sleep apnea (adult) (pediatric): Secondary | ICD-10-CM | POA: Diagnosis not present

## 2020-11-11 DIAGNOSIS — E7800 Pure hypercholesterolemia, unspecified: Secondary | ICD-10-CM | POA: Diagnosis not present

## 2020-11-11 LAB — ECHOCARDIOGRAM COMPLETE
Area-P 1/2: 2.69 cm2
S' Lateral: 3.14 cm

## 2020-11-23 ENCOUNTER — Other Ambulatory Visit: Payer: Self-pay | Admitting: Cardiology

## 2020-12-16 DIAGNOSIS — H8110 Benign paroxysmal vertigo, unspecified ear: Secondary | ICD-10-CM | POA: Diagnosis not present

## 2020-12-16 DIAGNOSIS — H6993 Unspecified Eustachian tube disorder, bilateral: Secondary | ICD-10-CM | POA: Diagnosis not present

## 2020-12-16 DIAGNOSIS — K5900 Constipation, unspecified: Secondary | ICD-10-CM | POA: Diagnosis not present

## 2020-12-18 ENCOUNTER — Other Ambulatory Visit: Payer: Self-pay | Admitting: Cardiology

## 2020-12-18 NOTE — Telephone Encounter (Signed)
Zetia sent to the patient pharmacy, Ozempic denied. This prescription was never sent from our office and is not on the patient med listd

## 2020-12-22 DIAGNOSIS — D649 Anemia, unspecified: Secondary | ICD-10-CM | POA: Diagnosis not present

## 2020-12-22 DIAGNOSIS — E119 Type 2 diabetes mellitus without complications: Secondary | ICD-10-CM | POA: Diagnosis not present

## 2020-12-22 DIAGNOSIS — R946 Abnormal results of thyroid function studies: Secondary | ICD-10-CM | POA: Diagnosis not present

## 2020-12-22 DIAGNOSIS — I129 Hypertensive chronic kidney disease with stage 1 through stage 4 chronic kidney disease, or unspecified chronic kidney disease: Secondary | ICD-10-CM | POA: Diagnosis not present

## 2021-01-01 DIAGNOSIS — I129 Hypertensive chronic kidney disease with stage 1 through stage 4 chronic kidney disease, or unspecified chronic kidney disease: Secondary | ICD-10-CM | POA: Diagnosis not present

## 2021-01-01 DIAGNOSIS — E559 Vitamin D deficiency, unspecified: Secondary | ICD-10-CM | POA: Diagnosis not present

## 2021-01-01 DIAGNOSIS — R946 Abnormal results of thyroid function studies: Secondary | ICD-10-CM | POA: Diagnosis not present

## 2021-01-01 DIAGNOSIS — K219 Gastro-esophageal reflux disease without esophagitis: Secondary | ICD-10-CM | POA: Diagnosis not present

## 2021-01-01 DIAGNOSIS — K5900 Constipation, unspecified: Secondary | ICD-10-CM | POA: Diagnosis not present

## 2021-01-01 DIAGNOSIS — I2510 Atherosclerotic heart disease of native coronary artery without angina pectoris: Secondary | ICD-10-CM | POA: Diagnosis not present

## 2021-01-01 DIAGNOSIS — E119 Type 2 diabetes mellitus without complications: Secondary | ICD-10-CM | POA: Diagnosis not present

## 2021-01-01 DIAGNOSIS — D649 Anemia, unspecified: Secondary | ICD-10-CM | POA: Diagnosis not present

## 2021-02-18 ENCOUNTER — Other Ambulatory Visit: Payer: Self-pay | Admitting: Cardiology

## 2021-02-19 ENCOUNTER — Other Ambulatory Visit: Payer: Self-pay | Admitting: Cardiology

## 2021-02-19 NOTE — Telephone Encounter (Signed)
Pharmacy notified atorvastatin refill to soon. Last fill was on 11/23/20 for a 90 day supply and 3 refill.

## 2021-03-09 DIAGNOSIS — E119 Type 2 diabetes mellitus without complications: Secondary | ICD-10-CM | POA: Diagnosis not present

## 2021-03-09 DIAGNOSIS — Z0100 Encounter for examination of eyes and vision without abnormal findings: Secondary | ICD-10-CM | POA: Diagnosis not present

## 2021-04-01 ENCOUNTER — Other Ambulatory Visit: Payer: Self-pay

## 2021-04-01 DIAGNOSIS — E559 Vitamin D deficiency, unspecified: Secondary | ICD-10-CM | POA: Insufficient documentation

## 2021-04-01 DIAGNOSIS — R946 Abnormal results of thyroid function studies: Secondary | ICD-10-CM | POA: Insufficient documentation

## 2021-04-01 DIAGNOSIS — Z9109 Other allergy status, other than to drugs and biological substances: Secondary | ICD-10-CM

## 2021-04-01 DIAGNOSIS — D649 Anemia, unspecified: Secondary | ICD-10-CM

## 2021-04-01 DIAGNOSIS — I129 Hypertensive chronic kidney disease with stage 1 through stage 4 chronic kidney disease, or unspecified chronic kidney disease: Secondary | ICD-10-CM

## 2021-04-01 HISTORY — DX: Anemia, unspecified: D64.9

## 2021-04-01 HISTORY — DX: Abnormal results of thyroid function studies: R94.6

## 2021-04-01 HISTORY — DX: Hypertensive chronic kidney disease with stage 1 through stage 4 chronic kidney disease, or unspecified chronic kidney disease: I12.9

## 2021-04-01 HISTORY — DX: Other allergy status, other than to drugs and biological substances: Z91.09

## 2021-04-06 ENCOUNTER — Telehealth (INDEPENDENT_AMBULATORY_CARE_PROVIDER_SITE_OTHER): Payer: Medicare Other | Admitting: Cardiology

## 2021-04-06 ENCOUNTER — Encounter: Payer: Self-pay | Admitting: Cardiology

## 2021-04-06 VITALS — BP 132/69 | HR 74 | Ht 65.5 in | Wt 261.2 lb

## 2021-04-06 DIAGNOSIS — I2510 Atherosclerotic heart disease of native coronary artery without angina pectoris: Secondary | ICD-10-CM

## 2021-04-06 DIAGNOSIS — E785 Hyperlipidemia, unspecified: Secondary | ICD-10-CM

## 2021-04-06 DIAGNOSIS — E119 Type 2 diabetes mellitus without complications: Secondary | ICD-10-CM

## 2021-04-06 NOTE — Patient Instructions (Signed)

## 2021-04-06 NOTE — Progress Notes (Signed)
Virtual Visit via Telephone Note   This visit type was conducted due to national recommendations for restrictions regarding the COVID-19 Pandemic (e.g. social distancing) in an effort to limit this patient's exposure and mitigate transmission in our community.  Due to her co-morbid illnesses, this patient is at least at moderate risk for complications without adequate follow up.  This format is felt to be most appropriate for this patient at this time.  The patient did not have access to video technology/had technical difficulties with video requiring transitioning to audio format only (telephone).  All issues noted in this document were discussed and addressed.  No physical exam could be performed with this format.  Please refer to the patient's chart for her  consent to telehealth for [facility].  Evaluation Performed:  Follow-up visit  This visit type was conducted due to national recommendations for restrictions regarding the COVID-19 Pandemic (e.g. social distancing).  This format is felt to be most appropriate for this patient at this time.  All issues noted in this document were discussed and addressed.  No physical exam was performed (except for noted visual exam findings with Video Visits).  Please refer to the patient's chart (MyChart message for video visits and phone note for telephone visits) for the patient's consent to telehealth for [facility].  Date:  04/06/2021  ID: Kristina Singleton, DOB 1951/08/24, MRN [id number]   Patient Location: [address] [place] [address]   Provider location:   [facility] [place] [facility]  PCP:  Irena Reichmann, DO  Cardiologist:  Gypsy Balsam, MD     Chief Complaint: I am doing much better  History of Present Illness:    Kristina Singleton is a 70 y.o. female  who presents via audio/video conferencing for a telehealth visit today.  With past medical history significant for coronary artery disease.  In 2003  she required PCI and stenting of unknown artery that was done in [place] she also got obstructive sleep apnea, essential hypertension, COPD, dyslipidemia, obesity.  She requested video visit today.  We were unable to establish video link therefore we talked only over the phone.  She says she feels significantly better last time we spoke she was weak tired and exhausted.  She was put on Ozempic by her primary care physician she is lost more than 30 pounds that she feels significantly better she is very happy about that and treatment.  Denies have any cardiac complaint.  There is no chest pain tightness squeezing pressure burning chest no palpitations no dizziness.  She is doing well   The patient does not have symptoms concerning for COVID-19 infection (fever, chills, cough, or new SHORTNESS OF BREATH).    Prior CV studies:   The following studies were reviewed today:  I did review her echocardiogram which showed preserved ejection fraction     Past Medical History:  Diagnosis Date  . Abnormal results of thyroid function studies 04/01/2021  . Acute right ankle pain 08/16/2018  . Anemia 04/01/2021  . Arthritis   . BPPV (benign paroxysmal positional vertigo) 02/01/2016  . CAD (coronary artery disease)    with stent placement  . CAD (coronary artery disease), native coronary artery 02/01/2016   PTCA and stenting of unknown artery in [place] in 2003  . Chronic pain   . Constipation 11/03/2019  . COPD (chronic obstructive pulmonary disease) (HCC)   . Coronary artery disease    with stent placement  . Diabetes mellitus without complication (  HCC)    doesn't have  . Dyslipidemia 02/01/2016  . Elevated liver enzymes 08/23/2017  . Family history of colon cancer   . Fibromyalgia   . Gastroesophageal reflux disease   . Gastroesophageal reflux disease without esophagitis 02/01/2016  . GERD (gastroesophageal reflux disease)   . Hypercholesterolemia 08/23/2017  . Hyperlipidemia   . Hypertension   .  Hypokalemia 08/23/2017  . Lower back pain    due to OA  . Lower extremity edema 08/23/2017  . 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 Morbid obesity (HCC) 02/01/2016  . Obesity   . Obesity   . Opioid dependence (HCC) 08/23/2017  . OSA (obstructive sleep apnea) 10/28/2019  . Other allergy status, other than to drugs and biological substances 04/01/2021  . Personal history of colonic polyps   . 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 Primary osteoarthritis of right ankle 08/29/2018  . Rectal bleeding 11/03/2019  . Right calf pain 01/07/2019  . Right foot pain 08/16/2018  . Sleep apnea   . Stress fracture of navicular bone of left foot 10/23/2018  . Type 2 diabetes mellitus without complication, without long-term current use of insulin (HCC) 08/23/2017  . Type II diabetes mellitus (HCC)    doesn't have    Past Surgical History:  Procedure Laterality Date  . ARTHRODESIS    . CARDIAC CATHETERIZATION    . CESAREAN SECTION    . CHOLECYSTECTOMY    . CHOLECYSTECTOMY, LAPAROSCOPIC    . COLONOSCOPY  09/17/2014   Colonic polyps status postr polypectomy. Mild sigmoid diverticulosis. Limited examination of the right side of the colon due to the quality of preparation.   . CORONARY ANGIOPLASTY    . ESOPHAGOGASTRODUODENOSCOPY  07/17/2013   Small hiatal hernia. Healed esophageal erosions (biopsied)   . FOOT SURGERY Left 2017   and in 2018 had to restructue it again  . HERNIA REPAIR  2012  . HYSTEROSCOPY    . 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 REPLACEMENT TOTAL KNEE  2016  . SHOULDER ARTHROSCOPY       Current Meds  Medication Sig  . albuterol (VENTOLIN HFA) 108 (90 Base) MCG/ACT inhaler Inhale 2 puffs into the lungs every 6 (six) hours as needed for wheezing or shortness of breath.  . AMBULATORY NON FORMULARY MEDICATION Take 30 drops by mouth 2 (two) times daily. CBD oil  . atorvastatin (LIPITOR) 40 MG tablet Take 1 tablet (40 mg total) by mouth daily.   . cetirizine (ZYRTEC) 10 MG tablet Take 1 mg by mouth daily.  . cloNIDine (CATAPRES) 0.3 MG tablet Take 0.3 mg by mouth at bedtime.  . clopidogrel (PLAVIX) 75 MG tablet Take 1 tablet by mouth daily.  . cyclobenzaprine (FLEXERIL) 10 MG tablet Take 1 tablet by mouth 2 (two) times daily.   Marland Kitchen esomeprazole (NEXIUM) 40 MG capsule Take 1 capsule by mouth 2 (two) times daily.  Marland Kitchen ezetimibe (ZETIA) 10 MG tablet Take 1 tablet (10 mg total) by mouth daily.  . famotidine (PEPCID) 40 MG tablet Take 1 tablet by mouth at bedtime.  . fluticasone (FLONASE) 50 MCG/ACT nasal spray Place 2 sprays into both nostrils 2 (two) times daily.  . furosemide (LASIX) 40 MG tablet Take 1 tablet by mouth as needed for fluid (Lower extremities).  . hydrALAZINE (APRESOLINE) 25 MG tablet Take 25 mg by mouth 3 (three) times daily.  . hydrochlorothiazide (HYDRODIURIL) 25 MG tablet Take 25 mg by mouth daily.  . hydrocortisone (ANUSOL-HC)  2.5 % rectal cream Place 1 application rectally 2 (two) times daily as needed for hemorrhoids or anal itching.  . Liraglutide -Weight Management (SAXENDA) 18 MG/3ML SOPN Inject 18 mg into the skin once a week.  . meclizine (ANTIVERT) 25 MG tablet Take 25-50 mg by mouth 2 (two) times daily as needed for dizziness.  Marland Kitchen NAPROXEN PO Take 2 tablets by mouth as needed (pain). Unknown strength  . nitroGLYCERIN (NITROSTAT) 0.4 MG SL tablet Place 1 tablet (0.4 mg total) under the tongue as needed for chest pain.  Marland Kitchen olmesartan (BENICAR) 40 MG tablet Take 40 mg by mouth daily.  Marland Kitchen olopatadine (PATANOL) 0.1 % ophthalmic solution Place 2 drops into both eyes as needed for allergies.  Marland Kitchen OZEMPIC, 0.25 OR 0.5 MG/DOSE, 2 MG/1.5ML SOPN Inject 0.25 mg into the skin daily.  . polyethylene glycol powder (GLYCOLAX/MIRALAX) 17 GM/SCOOP powder Take 1 g by mouth daily.  . potassium chloride SA (KLOR-CON) 20 MEQ tablet TAKE TWO TABLETS BY MOUTH TWICE DAILY (Patient taking differently: Take 20 mEq by mouth 2 (two) times daily.)   . Vitamin D, Ergocalciferol, (DRISDOL) 1.25 MG (50000 UNIT) CAPS capsule Take 50,000 Units by mouth once a week.  . [DISCONTINUED] cloNIDine (CATAPRES) 0.2 MG tablet Take 1 tablet (0.2 mg total) by mouth daily.  . [DISCONTINUED] montelukast (SINGULAIR) 10 MG tablet Take 10 mg by mouth at bedtime.  . [DISCONTINUED] sucralfate (CARAFATE) 1 g tablet Take 1 g by mouth 2 (two) times daily.      Family History: The patient's family history includes Arthritis in her daughter; CAD in her mother; Colon cancer in her sister; Heart attack in her father; Heart disease in her mother; Hypertension in her father and mother. There is no history of Breast cancer, Esophageal cancer, Stomach cancer, or Rectal cancer.   ROS:   Please see the history of present illness.     All other systems reviewed and are negative.   Labs/Other Tests and Data Reviewed:     Recent Labs: No results found for requested labs within last 8760 hours.  Recent Lipid Panel    Component Value Date/Time   CHOL 165 02/06/2019 0854   TRIG 134 02/06/2019 0854   HDL 70 02/06/2019 0854   CHOLHDL 2.4 02/06/2019 0854   LDLCALC 68 02/06/2019 0854      Exam:    Vital Signs:  BP 132/69   Pulse 74   Ht 5' 5.5" (1.664 m)   Wt 261 lb 3.2 oz (118.5 kg)   BMI 42.80 kg/m     Wt Readings from Last 3 Encounters:  04/06/21 261 lb 3.2 oz (118.5 kg)  10/19/20 283 lb (128.4 kg)  04/17/20 300 lb (136.1 kg)     Well nourished, well developed in no acute distress. Alert awake vented x3 sounds happy over the phone not in any distress.  Diagnosis for this visit:   No diagnosis found.   ASSESSMENT & PLAN:    1.  Coronary artery disease: Stable from that point review denies have any symptoms suggesting reactivation of the problem. 2.  Profound fatigue and tiredness that was complained last time at that time I cut down her clonidine hoping that that will improve her symptomology but that did not what did improve her  symptomatology is the fact that she lost more than 30 pounds on Ozempic and she feels better. 3.  Dyslipidemia will call primary care physician to get her fasting lipid profile.  I did review K PN which show  me data from end of last year with LDL of 64 and HDL 52.  She is already on Lipitor 40 which is high intense statin which I will continue. 4.  Diabetes mellitus followed by antimedicine team.  COVID-19 Education: The signs and symptoms of COVID-19 were discussed with the patient and how to seek care for testing (follow up with PCP or arrange E-visit).  The importance of social distancing was discussed today.  Patient Risk:   After full review of this patients clinical status, I feel that they are at least moderate risk at this time.  Time:   Today, I have spent 15 minutes with the patient with telehealth technology discussing pt health issues.  I spent 15 minutes reviewing her chart before the visit.  Visit was finished at 2:43 PM.    Medication Adjustments/Labs and Tests Ordered: Current medicines are reviewed at length with the patient today.  Concerns regarding medicines are outlined above.  No orders of the defined types were placed in this encounter.  Medication changes: No orders of the defined types were placed in this encounter.    Disposition: 6 months follow-up  Signed, Georgeanna Leaobert J. Tyhesha Dutson, MD, [facility] 04/06/2021 2:40 PM    [facility]

## 2021-04-27 DIAGNOSIS — D649 Anemia, unspecified: Secondary | ICD-10-CM | POA: Diagnosis not present

## 2021-04-27 DIAGNOSIS — E119 Type 2 diabetes mellitus without complications: Secondary | ICD-10-CM | POA: Diagnosis not present

## 2021-04-27 DIAGNOSIS — E559 Vitamin D deficiency, unspecified: Secondary | ICD-10-CM | POA: Diagnosis not present

## 2021-04-27 DIAGNOSIS — R946 Abnormal results of thyroid function studies: Secondary | ICD-10-CM | POA: Diagnosis not present

## 2021-04-27 DIAGNOSIS — I129 Hypertensive chronic kidney disease with stage 1 through stage 4 chronic kidney disease, or unspecified chronic kidney disease: Secondary | ICD-10-CM | POA: Diagnosis not present

## 2021-05-04 DIAGNOSIS — M542 Cervicalgia: Secondary | ICD-10-CM | POA: Diagnosis not present

## 2021-05-04 DIAGNOSIS — M25522 Pain in left elbow: Secondary | ICD-10-CM | POA: Diagnosis not present

## 2021-05-04 DIAGNOSIS — M79642 Pain in left hand: Secondary | ICD-10-CM | POA: Diagnosis not present

## 2021-05-04 DIAGNOSIS — Z0000 Encounter for general adult medical examination without abnormal findings: Secondary | ICD-10-CM | POA: Diagnosis not present

## 2021-05-14 DIAGNOSIS — M5412 Radiculopathy, cervical region: Secondary | ICD-10-CM | POA: Diagnosis not present

## 2021-05-14 DIAGNOSIS — M25522 Pain in left elbow: Secondary | ICD-10-CM | POA: Diagnosis not present

## 2021-05-14 DIAGNOSIS — G5622 Lesion of ulnar nerve, left upper limb: Secondary | ICD-10-CM | POA: Diagnosis not present

## 2021-05-22 DIAGNOSIS — G4733 Obstructive sleep apnea (adult) (pediatric): Secondary | ICD-10-CM | POA: Diagnosis not present

## 2021-06-07 ENCOUNTER — Other Ambulatory Visit: Payer: Self-pay | Admitting: Cardiology

## 2021-06-16 DIAGNOSIS — Z20822 Contact with and (suspected) exposure to covid-19: Secondary | ICD-10-CM | POA: Diagnosis not present

## 2021-06-22 ENCOUNTER — Other Ambulatory Visit: Payer: Self-pay | Admitting: Cardiology

## 2021-06-23 ENCOUNTER — Other Ambulatory Visit: Payer: Self-pay | Admitting: Cardiology

## 2021-07-14 DIAGNOSIS — R202 Paresthesia of skin: Secondary | ICD-10-CM | POA: Diagnosis not present

## 2021-07-14 DIAGNOSIS — R200 Anesthesia of skin: Secondary | ICD-10-CM | POA: Diagnosis not present

## 2021-08-23 DIAGNOSIS — M5416 Radiculopathy, lumbar region: Secondary | ICD-10-CM | POA: Diagnosis not present

## 2021-08-23 DIAGNOSIS — M797 Fibromyalgia: Secondary | ICD-10-CM | POA: Diagnosis not present

## 2021-08-23 DIAGNOSIS — M5136 Other intervertebral disc degeneration, lumbar region: Secondary | ICD-10-CM | POA: Diagnosis not present

## 2021-08-23 DIAGNOSIS — G8929 Other chronic pain: Secondary | ICD-10-CM | POA: Diagnosis not present

## 2021-09-02 DIAGNOSIS — R200 Anesthesia of skin: Secondary | ICD-10-CM | POA: Diagnosis not present

## 2021-09-02 DIAGNOSIS — R202 Paresthesia of skin: Secondary | ICD-10-CM | POA: Diagnosis not present

## 2021-09-06 DIAGNOSIS — R200 Anesthesia of skin: Secondary | ICD-10-CM | POA: Diagnosis not present

## 2021-09-06 DIAGNOSIS — M5412 Radiculopathy, cervical region: Secondary | ICD-10-CM | POA: Diagnosis not present

## 2021-09-06 DIAGNOSIS — M25522 Pain in left elbow: Secondary | ICD-10-CM | POA: Diagnosis not present

## 2021-09-06 DIAGNOSIS — M7712 Lateral epicondylitis, left elbow: Secondary | ICD-10-CM | POA: Diagnosis not present

## 2021-09-06 DIAGNOSIS — R202 Paresthesia of skin: Secondary | ICD-10-CM | POA: Diagnosis not present

## 2021-10-01 DIAGNOSIS — H524 Presbyopia: Secondary | ICD-10-CM | POA: Diagnosis not present

## 2021-10-07 ENCOUNTER — Encounter: Payer: Self-pay | Admitting: Cardiology

## 2021-10-07 ENCOUNTER — Telehealth: Payer: Self-pay | Admitting: Cardiology

## 2021-10-07 ENCOUNTER — Ambulatory Visit (INDEPENDENT_AMBULATORY_CARE_PROVIDER_SITE_OTHER): Payer: Medicare Other | Admitting: Cardiology

## 2021-10-07 VITALS — BP 124/78 | HR 70 | Ht 65.5 in | Wt 260.0 lb

## 2021-10-07 DIAGNOSIS — G4733 Obstructive sleep apnea (adult) (pediatric): Secondary | ICD-10-CM

## 2021-10-07 DIAGNOSIS — E785 Hyperlipidemia, unspecified: Secondary | ICD-10-CM | POA: Diagnosis not present

## 2021-10-07 DIAGNOSIS — J449 Chronic obstructive pulmonary disease, unspecified: Secondary | ICD-10-CM | POA: Diagnosis not present

## 2021-10-07 DIAGNOSIS — I2510 Atherosclerotic heart disease of native coronary artery without angina pectoris: Secondary | ICD-10-CM

## 2021-10-07 NOTE — Telephone Encounter (Signed)
Patient is scheduled for this afternoon, 11/10 at 2:00 PM with Dr. Bing Matter. However, she states she isn't feeling well and would like to convert her appointment to a virtual if possible. Contacted RN for advisement with no success. Please return call to patient when able.

## 2021-10-07 NOTE — Patient Instructions (Signed)

## 2021-10-07 NOTE — Progress Notes (Signed)
Virtual Visit via Telephone Note   This visit type was conducted due to national recommendations for restrictions regarding the COVID-19 Pandemic (e.g. social distancing) in an effort to limit this patient's exposure and mitigate transmission in our community.  Due to her co-morbid illnesses, this patient is at least at moderate risk for complications without adequate follow up.  This format is felt to be most appropriate for this patient at this time.  The patient did not have access to video technology/had technical difficulties with video requiring transitioning to audio format only (telephone).  All issues noted in this document were discussed and addressed.  No physical exam could be performed with this format.  Please refer to the patient's chart for her  consent to telehealth for [facility].  Evaluation Performed:  Follow-up visit  This visit type was conducted due to national recommendations for restrictions regarding the COVID-19 Pandemic (e.g. social distancing).  This format is felt to be most appropriate for this patient at this time.  All issues noted in this document were discussed and addressed.  No physical exam was performed (except for noted visual exam findings with Video Visits).  Please refer to the patient's chart (MyChart message for video visits and phone note for telephone visits) for the patient's consent to telehealth for [facility].  Date:  10/07/2021  ID: Kristina Singleton, DOB [birth year], MRN [id number]   Patient Location: [address] [place] [address]   Provider location:   [facility] [place] [facility]  PCP:  Irena Reichmann, DO  Cardiologist:  Gypsy Balsam, MD     Chief Complaint: I am doing fine  History of Present Illness:    Kristina Singleton is a 70 y.o. female  who presents via audio/video conferencing for a telehealth visit today.  With past medical history significant for coronary artery disease in 2003 she required PTCA  and stenting to unknown artery at that was done in [place].  Also obstructive sleep apnea, essential hypertension, COPD, dyslipidemia, obesity.  She does have televisit with me today because she did not feel well.  She was checked for COVID likely she does not have it.  But still congested and she preferred to have virtual visit.  She was unable to establish video link therefore we do telephone conversation I am out of office in [facility] she is at her home.  Overall cardiac wise denies have any problems.  There is no chest pain tightness squeezing pressure burning chest overall she is very proud her self because she lost almost 20 pounds.   The patient does not have symptoms concerning for COVID-19 infection (fever, chills, cough, or new SHORTNESS OF BREATH).    Prior CV studies:   The following studies were reviewed today:       Past Medical History:  Diagnosis Date   Abnormal results of thyroid function studies 04/01/2021   Acute right ankle pain 08/16/2018   Anemia 04/01/2021   Arthritis    BPPV (benign paroxysmal positional vertigo) 02/01/2016   CAD (coronary artery disease)    with stent placement   CAD (coronary artery disease), native coronary artery 02/01/2016   PTCA and stenting of unknown artery in [place] in 2003   Chronic pain    Constipation 11/03/2019   COPD (chronic obstructive pulmonary disease) (HCC)    Coronary artery disease    with stent placement   Diabetes mellitus without complication (HCC)    doesn't have   Dyslipidemia 02/01/2016  Elevated liver enzymes 08/23/2017   Family history of colon cancer    Fibromyalgia    Gastroesophageal reflux disease    Gastroesophageal reflux disease without esophagitis 02/01/2016   GERD (gastroesophageal reflux disease)    Hypercholesterolemia 08/23/2017   Hyperlipidemia    Hypertension    Hypokalemia 08/23/2017   Lower back pain    due to OA   Lower extremity edema 08/23/2017   Malignant hypertensive chronic kidney disease  04/01/2021   Morbid obesity (HCC) 02/01/2016   Obesity    Obesity    Opioid dependence (HCC) 08/23/2017   OSA (obstructive sleep apnea) 10/28/2019   Other allergy status, other than to drugs and biological substances 04/01/2021   Personal history of colonic polyps    Pes planus, congenital, 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   Primary osteoarthritis of right ankle 08/29/2018   Rectal bleeding 11/03/2019   Right calf pain 01/07/2019   Right foot pain 08/16/2018   Sleep apnea    Stress fracture of navicular bone of left foot 10/23/2018   Type 2 diabetes mellitus without complication, without long-term current use of insulin (HCC) 08/23/2017   Type II diabetes mellitus (HCC)    doesn't have    Past Surgical History:  Procedure Laterality Date   ARTHRODESIS     CARDIAC CATHETERIZATION     CESAREAN SECTION     CHOLECYSTECTOMY     CHOLECYSTECTOMY, LAPAROSCOPIC     COLONOSCOPY  09/17/2014   Colonic polyps status postr polypectomy. Mild sigmoid diverticulosis. Limited examination of the right side of the colon due to the quality of preparation.    CORONARY ANGIOPLASTY     ESOPHAGOGASTRODUODENOSCOPY  07/17/2013   Small hiatal hernia. Healed esophageal erosions (biopsied)    FOOT SURGERY Left 2017   and in 2018 had to restructue it again   HERNIA REPAIR  2012   HYSTEROSCOPY     REPAIR NONUNION / MALUNION METATARSAL / TARSAL BONES     REPLACEMENT TOTAL KNEE  2016   SHOULDER ARTHROSCOPY       Current Meds  Medication Sig   albuterol (VENTOLIN HFA) 108 (90 Base) MCG/ACT inhaler Inhale 2 puffs into the lungs every 6 (six) hours as needed for wheezing or shortness of breath.   AMBULATORY NON FORMULARY MEDICATION Take 30 drops by mouth 2 (two) times daily. CBD oil   atorvastatin (LIPITOR) 40 MG tablet Take 1 tablet (40 mg total) by mouth daily.   cetirizine (ZYRTEC) 10 MG tablet Take 1 mg by mouth daily.   cloNIDine (CATAPRES) 0.3 MG tablet Take 0.3 mg by mouth at bedtime.   clopidogrel (PLAVIX) 75 MG tablet take  ONE TABLET BY MOUTH ONCE DAILY (Patient taking differently: Take 75 mg by mouth daily.)   cyclobenzaprine (FLEXERIL) 10 MG tablet Take 1 tablet by mouth 2 (two) times daily.    esomeprazole (NEXIUM) 40 MG capsule Take 1 capsule by mouth 2 (two) times daily.   ezetimibe (ZETIA) 10 MG tablet Take 1 tablet (10 mg total) by mouth daily.   famotidine (PEPCID) 40 MG tablet Take 1 tablet by mouth at bedtime.   fluticasone (FLONASE) 50 MCG/ACT nasal spray Place 2 sprays into both nostrils 2 (two) times daily.   furosemide (LASIX) 40 MG tablet Take 1 tablet by mouth as needed for fluid (Lower extremities).   hydrALAZINE (APRESOLINE) 25 MG tablet Take 25 mg by mouth 3 (three) times daily.   hydrochlorothiazide (HYDRODIURIL) 25 MG tablet Take 25 mg by mouth daily.   meclizine (ANTIVERT) 25 MG  tablet Take 25-50 mg by mouth 2 (two) times daily as needed for dizziness.   NAPROXEN PO Take 2 tablets by mouth as needed (pain). Unknown strength   nitroGLYCERIN (NITROSTAT) 0.4 MG SL tablet Place 1 tablet (0.4 mg total) under the tongue as needed for chest pain.   olmesartan (BENICAR) 40 MG tablet Take 40 mg by mouth daily.   olopatadine (PATANOL) 0.1 % ophthalmic solution Place 2 drops into both eyes as needed for allergies.   OZEMPIC, 0.25 OR 0.5 MG/DOSE, 2 MG/1.5ML SOPN Inject 0.25 mg into the skin daily.   polyethylene glycol powder (GLYCOLAX/MIRALAX) 17 GM/SCOOP powder Take 1 g by mouth daily.   potassium chloride SA (KLOR-CON) 20 MEQ tablet Take 1 tablet (20 mEq total) by mouth 2 (two) times daily.   Vitamin D, Ergocalciferol, (DRISDOL) 1.25 MG (50000 UNIT) CAPS capsule Take 50,000 Units by mouth once a week.      Family History: The patient's family history includes Arthritis in her daughter; CAD in her mother; Colon cancer in her sister; Heart attack in her father; Heart disease in her mother; Hypertension in her father and mother. There is no history of Breast cancer, Esophageal cancer, Stomach cancer, or  Rectal cancer.   ROS:   Please see the history of present illness.     All other systems reviewed and are negative.   Labs/Other Tests and Data Reviewed:     Recent Labs: No results found for requested labs within last 8760 hours.  Recent Lipid Panel    Component Value Date/Time   CHOL 165 02/06/2019 0854   TRIG 134 02/06/2019 0854   HDL 70 02/06/2019 0854   CHOLHDL 2.4 02/06/2019 0854   LDLCALC 68 02/06/2019 0854      Exam:    Vital Signs:  BP 124/78   Pulse 70   Ht 5' 5.5" (1.664 m)   Wt 260 lb (117.9 kg)   BMI 42.61 kg/m     Wt Readings from Last 3 Encounters:  10/07/21 260 lb (117.9 kg)  04/06/21 261 lb 3.2 oz (118.5 kg)  10/19/20 283 lb (128.4 kg)     Well nourished, well developed in no acute distress. Alert awake oriented x3 signs noted in distress however somewhat congested.  Diagnosis for this visit:   1. Coronary artery disease involving native coronary artery of native heart without angina pectoris   2. Chronic obstructive pulmonary disease, unspecified COPD type (HCC)   3. OSA (obstructive sleep apnea)   4. Dyslipidemia      ASSESSMENT & PLAN:    1.  Coronary disease stable from that point review on appropriate medications which I will continue 2.  Chronic obstructive pulmonary disease followed by antimedicine team.  She is not smoking anymore. 3.  Obstructive sleep apnea that follow-up by internal medicine team 4.  Dyslipidemia: She was told by her primary care physician does everything is fine I did review her K PN which show LDL of 64 HDL 65.  She is on atorvastatin as well as Zetia which I will continue.  COVID-19 Education: The signs and symptoms of COVID-19 were discussed with the patient and how to seek care for testing (follow up with PCP or arrange E-visit).  The importance of social distancing was discussed today.  Patient Risk:   After full review of this patients clinical status, I feel that they are at least moderate risk at  this time.  Time:   Today, I have spent 15 minutes with the patient  with telehealth technology discussing pt health issues.  I spent reviewing her chart before the visit.  Visit was finished at 2:35.    Medication Adjustments/Labs and Tests Ordered: Current medicines are reviewed at length with the patient today.  Concerns regarding medicines are outlined above.  No orders of the defined types were placed in this encounter.  Medication changes: No orders of the defined types were placed in this encounter.    Disposition:  6 months  Signed, Georgeanna Lea, MD, [facility] 10/07/2021 2:31 PM    [facility] '

## 2021-10-07 NOTE — Telephone Encounter (Signed)
Spoke to patient. Informed her we will do virtual appointment.

## 2021-11-03 DIAGNOSIS — R200 Anesthesia of skin: Secondary | ICD-10-CM | POA: Diagnosis not present

## 2021-11-03 DIAGNOSIS — M7712 Lateral epicondylitis, left elbow: Secondary | ICD-10-CM | POA: Diagnosis not present

## 2021-11-03 DIAGNOSIS — R202 Paresthesia of skin: Secondary | ICD-10-CM | POA: Diagnosis not present

## 2021-11-03 DIAGNOSIS — E7800 Pure hypercholesterolemia, unspecified: Secondary | ICD-10-CM | POA: Diagnosis not present

## 2021-11-03 DIAGNOSIS — M5412 Radiculopathy, cervical region: Secondary | ICD-10-CM | POA: Diagnosis not present

## 2021-11-03 DIAGNOSIS — I129 Hypertensive chronic kidney disease with stage 1 through stage 4 chronic kidney disease, or unspecified chronic kidney disease: Secondary | ICD-10-CM | POA: Diagnosis not present

## 2021-11-03 DIAGNOSIS — G5622 Lesion of ulnar nerve, left upper limb: Secondary | ICD-10-CM | POA: Diagnosis not present

## 2021-11-03 DIAGNOSIS — E119 Type 2 diabetes mellitus without complications: Secondary | ICD-10-CM | POA: Diagnosis not present

## 2021-11-10 DIAGNOSIS — M797 Fibromyalgia: Secondary | ICD-10-CM | POA: Diagnosis not present

## 2021-11-10 DIAGNOSIS — G8929 Other chronic pain: Secondary | ICD-10-CM | POA: Diagnosis not present

## 2021-11-10 DIAGNOSIS — M5136 Other intervertebral disc degeneration, lumbar region: Secondary | ICD-10-CM | POA: Diagnosis not present

## 2021-11-10 DIAGNOSIS — I2510 Atherosclerotic heart disease of native coronary artery without angina pectoris: Secondary | ICD-10-CM | POA: Diagnosis not present

## 2021-11-18 DIAGNOSIS — G4733 Obstructive sleep apnea (adult) (pediatric): Secondary | ICD-10-CM | POA: Diagnosis not present

## 2021-12-16 ENCOUNTER — Other Ambulatory Visit: Payer: Self-pay | Admitting: Cardiology

## 2021-12-16 NOTE — Telephone Encounter (Signed)
Clopidogrel 75 mg # 90 x 1 refill sent to   [place], [place]

## 2022-02-15 DIAGNOSIS — M542 Cervicalgia: Secondary | ICD-10-CM | POA: Diagnosis not present

## 2022-02-15 DIAGNOSIS — M4802 Spinal stenosis, cervical region: Secondary | ICD-10-CM | POA: Insufficient documentation

## 2022-02-15 DIAGNOSIS — M5030 Other cervical disc degeneration, unspecified cervical region: Secondary | ICD-10-CM | POA: Insufficient documentation

## 2022-02-15 DIAGNOSIS — M5412 Radiculopathy, cervical region: Secondary | ICD-10-CM | POA: Diagnosis not present

## 2022-02-18 DIAGNOSIS — M7712 Lateral epicondylitis, left elbow: Secondary | ICD-10-CM | POA: Diagnosis not present

## 2022-03-01 ENCOUNTER — Other Ambulatory Visit: Payer: Self-pay | Admitting: Cardiology

## 2022-03-08 DIAGNOSIS — M67822 Other specified disorders of synovium, left elbow: Secondary | ICD-10-CM | POA: Diagnosis not present

## 2022-03-08 DIAGNOSIS — M67922 Unspecified disorder of synovium and tendon, left upper arm: Secondary | ICD-10-CM | POA: Diagnosis not present

## 2022-03-08 DIAGNOSIS — M19022 Primary osteoarthritis, left elbow: Secondary | ICD-10-CM | POA: Diagnosis not present

## 2022-03-14 DIAGNOSIS — M5030 Other cervical disc degeneration, unspecified cervical region: Secondary | ICD-10-CM | POA: Diagnosis not present

## 2022-03-14 DIAGNOSIS — M4802 Spinal stenosis, cervical region: Secondary | ICD-10-CM | POA: Diagnosis not present

## 2022-03-14 DIAGNOSIS — M5412 Radiculopathy, cervical region: Secondary | ICD-10-CM | POA: Diagnosis not present

## 2022-03-14 DIAGNOSIS — R293 Abnormal posture: Secondary | ICD-10-CM | POA: Diagnosis not present

## 2022-03-14 DIAGNOSIS — M6281 Muscle weakness (generalized): Secondary | ICD-10-CM | POA: Diagnosis not present

## 2022-03-25 DIAGNOSIS — M7712 Lateral epicondylitis, left elbow: Secondary | ICD-10-CM | POA: Diagnosis not present

## 2022-04-07 ENCOUNTER — Other Ambulatory Visit: Payer: Self-pay | Admitting: Cardiology

## 2022-04-20 ENCOUNTER — Ambulatory Visit (INDEPENDENT_AMBULATORY_CARE_PROVIDER_SITE_OTHER): Payer: Medicare Other | Admitting: Cardiology

## 2022-04-20 ENCOUNTER — Encounter: Payer: Self-pay | Admitting: Cardiology

## 2022-04-20 VITALS — BP 112/62 | HR 65 | Ht 66.0 in | Wt 237.0 lb

## 2022-04-20 DIAGNOSIS — M79602 Pain in left arm: Secondary | ICD-10-CM | POA: Diagnosis not present

## 2022-04-20 DIAGNOSIS — I10 Essential (primary) hypertension: Secondary | ICD-10-CM | POA: Diagnosis not present

## 2022-04-20 DIAGNOSIS — E782 Mixed hyperlipidemia: Secondary | ICD-10-CM | POA: Diagnosis not present

## 2022-04-20 DIAGNOSIS — G4733 Obstructive sleep apnea (adult) (pediatric): Secondary | ICD-10-CM

## 2022-04-20 DIAGNOSIS — R293 Abnormal posture: Secondary | ICD-10-CM | POA: Diagnosis not present

## 2022-04-20 DIAGNOSIS — M5412 Radiculopathy, cervical region: Secondary | ICD-10-CM | POA: Diagnosis not present

## 2022-04-20 DIAGNOSIS — R0609 Other forms of dyspnea: Secondary | ICD-10-CM | POA: Diagnosis not present

## 2022-04-20 DIAGNOSIS — E119 Type 2 diabetes mellitus without complications: Secondary | ICD-10-CM | POA: Diagnosis not present

## 2022-04-20 DIAGNOSIS — M5416 Radiculopathy, lumbar region: Secondary | ICD-10-CM | POA: Insufficient documentation

## 2022-04-20 DIAGNOSIS — R2689 Other abnormalities of gait and mobility: Secondary | ICD-10-CM | POA: Diagnosis not present

## 2022-04-20 DIAGNOSIS — I2510 Atherosclerotic heart disease of native coronary artery without angina pectoris: Secondary | ICD-10-CM | POA: Diagnosis not present

## 2022-04-20 DIAGNOSIS — N1831 Chronic kidney disease, stage 3a: Secondary | ICD-10-CM | POA: Insufficient documentation

## 2022-04-20 NOTE — Progress Notes (Signed)
Cardiology Office Note:    Date:  04/20/2022   ID:  Kristina Singleton, DOB [birth year], MRN [id number]  PCP:  Irena Reichmann, DO  Cardiologist:  Gypsy Balsam, MD    Referring MD: Irena Reichmann, DO   Chief Complaint  Patient presents with   Follow-up  Doing fine but is short of breath  History of Present Illness:    Kristina Singleton is a 71 y.o. female with past medical history significant for coronary disease in 2003 she had required PTCA and stenting of unknown artery, diabetes, essential hypertension, COPD, dyslipidemia, obesity, sleep apnea.  Comes today to my office for follow-up she complain of being weak tired exhausted.  She also complained of having some shortness of breath and fatigue.  No chest pain tightness squeezing pressure burning in her chest.  Past Medical History:  Diagnosis Date   Abnormal results of thyroid function studies 04/01/2021   Acute right ankle pain 08/16/2018   Anemia 04/01/2021   Arthritis    BPPV (benign paroxysmal positional vertigo) 02/01/2016   CAD (coronary artery disease)    with stent placement   CAD (coronary artery disease), native coronary artery 02/01/2016   PTCA and stenting of unknown artery in [place] in 2003   Chronic pain    Constipation 11/03/2019   COPD (chronic obstructive pulmonary disease) (HCC)    Coronary artery disease    with stent placement   Diabetes mellitus without complication (HCC)    doesn't have   Dyslipidemia 02/01/2016   Elevated liver enzymes 08/23/2017   Family history of colon cancer    Fibromyalgia    Gastroesophageal reflux disease    Gastroesophageal reflux disease without esophagitis 02/01/2016   GERD (gastroesophageal reflux disease)    Hypercholesterolemia 08/23/2017   Hyperlipidemia    Hypertension    Hypokalemia 08/23/2017   Lower back pain    due to OA   Lower extremity edema 08/23/2017   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   Morbid obesity (HCC) 02/01/2016   Obesity     Obesity    Opioid dependence (HCC) 08/23/2017   OSA (obstructive sleep apnea) 10/28/2019   Other allergy status, other than to drugs and biological substances 04/01/2021   Personal history of colonic polyps    Pes planus, congenital, 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   Primary osteoarthritis of right ankle 08/29/2018   Rectal bleeding 11/03/2019   Right calf pain 01/07/2019   Right foot pain 08/16/2018   Sleep apnea    Stress fracture of navicular bone of left foot 10/23/2018   Type 2 diabetes mellitus without complication, without long-term current use of insulin (HCC) 08/23/2017   Type II diabetes mellitus (HCC)    doesn't have    Past Surgical History:  Procedure Laterality Date   ARTHRODESIS     CARDIAC CATHETERIZATION     CESAREAN SECTION     CHOLECYSTECTOMY     CHOLECYSTECTOMY, LAPAROSCOPIC     COLONOSCOPY  09/17/2014   Colonic polyps status postr polypectomy. Mild sigmoid diverticulosis. Limited examination of the right side of the colon due to the quality of preparation.    CORONARY ANGIOPLASTY     ESOPHAGOGASTRODUODENOSCOPY  07/17/2013   Small hiatal hernia. Healed esophageal erosions (biopsied)    FOOT SURGERY Left 2017   and in 2018 had to restructue it again   HERNIA REPAIR  2012   HYSTEROSCOPY     REPAIR NONUNION / MALUNION METATARSAL / TARSAL BONES     REPLACEMENT TOTAL KNEE  2016  SHOULDER ARTHROSCOPY      Current Medications: Current Meds  Medication Sig   albuterol (VENTOLIN HFA) 108 (90 Base) MCG/ACT inhaler Inhale 2 puffs into the lungs every 6 (six) hours as needed for wheezing or shortness of breath.   AMBULATORY NON FORMULARY MEDICATION Take 30 drops by mouth 2 (two) times daily. CBD oil   atorvastatin (LIPITOR) 40 MG tablet Take 1 tablet (40 mg total) by mouth daily.   cetirizine (ZYRTEC) 10 MG tablet Take 1 mg by mouth daily.   cloNIDine (CATAPRES) 0.3 MG tablet Take 0.3 mg by mouth at bedtime.   clopidogrel (PLAVIX) 75 MG tablet take ONE TABLET BY MOUTH ONCE DAILY  (Patient taking differently: Take 75 mg by mouth daily.)   cyclobenzaprine (FLEXERIL) 10 MG tablet Take 1 tablet by mouth 2 (two) times daily.    esomeprazole (NEXIUM) 40 MG capsule Take 1 capsule by mouth as needed (Acid Reflux).   ezetimibe (ZETIA) 10 MG tablet Take 1 tablet (10 mg total) by mouth daily.   famotidine (PEPCID) 40 MG tablet Take 1 tablet by mouth at bedtime.   fluticasone (FLONASE) 50 MCG/ACT nasal spray Place 2 sprays into both nostrils 2 (two) times daily.   furosemide (LASIX) 40 MG tablet Take 1 tablet by mouth as needed for fluid (Lower extremities).   hydrALAZINE (APRESOLINE) 25 MG tablet Take 25 mg by mouth 3 (three) times daily.   meclizine (ANTIVERT) 25 MG tablet Take 25-50 mg by mouth 2 (two) times daily as needed for dizziness.   NAPROXEN PO Take 2 tablets by mouth as needed (pain). Unknown strength   nitroGLYCERIN (NITROSTAT) 0.4 MG SL tablet Place 1 tablet (0.4 mg total) under the tongue as needed for chest pain.   olmesartan (BENICAR) 40 MG tablet Take 40 mg by mouth daily.   olopatadine (PATANOL) 0.1 % ophthalmic solution Place 2 drops into both eyes as needed for allergies.   OZEMPIC, 0.25 OR 0.5 MG/DOSE, 2 MG/1.5ML SOPN Inject 0.25 mg into the skin daily.   polyethylene glycol powder (GLYCOLAX/MIRALAX) 17 GM/SCOOP powder Take 1 g by mouth daily.   Vitamin D, Ergocalciferol, (DRISDOL) 1.25 MG (50000 UNIT) CAPS capsule Take 50,000 Units by mouth once a week.   [DISCONTINUED] potassium chloride SA (KLOR-CON M) 20 MEQ tablet Take 1 tablet (20 mEq total) by mouth 2 (two) times daily.     Allergies:   Azithromycin, Celecoxib, Morphine, Duloxetine, Erythromycin base, and Lisinopril   Social History   Socioeconomic History   Marital status: Married    Spouse name: Not on file   Number of children: 2   Years of education: Not on file   Highest education level: Not on file  Occupational History   Occupation: Retired   Tobacco Use   Smoking status: Former     Types: Cigarettes    Quit date: 1989    Years since quitting: 34.4   Smokeless tobacco: Never  Vaping Use   Vaping Use: Some days  Substance and Sexual Activity   Alcohol use: Yes    Alcohol/week: 2.0 standard drinks    Types: 2 Glasses of wine per week   Drug use: Never   Sexual activity: Not on file  Other Topics Concern   Not on file  Social History Narrative   Not on file   Social Determinants of Health   Financial Resource Strain: Not on file  Food Insecurity: Not on file  Transportation Needs: Not on file  Physical Activity: Not on file  Stress: Not on file  Social Connections: Not on file     Family History: The patient's family history includes Arthritis in her daughter; CAD in her mother; Colon cancer in her sister; Heart attack in her father; Heart disease in her mother; Hypertension in her father and mother. There is no history of Breast cancer, Esophageal cancer, Stomach cancer, or Rectal cancer. ROS:   Please see the history of present illness.    All 14 point review of systems negative except as described per history of present illness  EKGs/Labs/Other Studies Reviewed:      Recent Labs: No results found for requested labs within last 8760 hours.  Recent Lipid Panel    Component Value Date/Time   CHOL 165 02/06/2019 0854   TRIG 134 02/06/2019 0854   HDL 70 02/06/2019 0854   CHOLHDL 2.4 02/06/2019 0854   LDLCALC 68 02/06/2019 0854    Physical Exam:    VS:  BP 112/62 (BP Location: Right Arm, Patient Position: Sitting)   Pulse 65   Ht 5\' 6"  (1.676 m)   Wt 237 lb (107.5 kg)   SpO2 97%   BMI 38.25 kg/m     Wt Readings from Last 3 Encounters:  04/20/22 237 lb (107.5 kg)  10/07/21 260 lb (117.9 kg)  04/06/21 261 lb 3.2 oz (118.5 kg)     GEN:  Well nourished, well developed in no acute distress HEENT: Normal NECK: No JVD; No carotid bruits LYMPHATICS: No lymphadenopathy CARDIAC: RRR, no murmurs, no rubs, no gallops RESPIRATORY:  Clear to  auscultation without rales, wheezing or rhonchi  ABDOMEN: Soft, non-tender, non-distended MUSCULOSKELETAL:  No edema; No deformity  SKIN: Warm and dry LOWER EXTREMITIES: no swelling NEUROLOGIC:  Alert and oriented x 3 PSYCHIATRIC:  Normal affect   ASSESSMENT:    1. Coronary artery disease involving native coronary artery of native heart without angina pectoris   2. Primary hypertension   3. OSA (obstructive sleep apnea)   4. Type 2 diabetes mellitus without complication, without long-term current use of insulin (HCC)   5. Mixed hyperlipidemia    PLAN:    In order of problems listed above:  Coronary disease, stable denies have any chest pain tightness squeezing pressure burning chest on appropriate medication which include antiplatelets therapy which I will continue Essential hypertension blood pressure well controlled continue present management. Dyspnea fatigue shortness of breath will schedule her to have echocardiogram to assess left ventricle ejection fraction she tells me that taking bronchodilator helps therefore make me think that probably with dealing with some lung issue however I need to rule out potential cardiac problems. Type 2 diabetes that being followed by internal medicine team Mixed dyslipidemia I did review her K PN which show me LDL of 64 HDL of 65 however this is from year ago.  We will do fasting lipid profile today   Medication Adjustments/Labs and Tests Ordered: Current medicines are reviewed at length with the patient today.  Concerns regarding medicines are outlined above.  No orders of the defined types were placed in this encounter.  Medication changes: No orders of the defined types were placed in this encounter.   Signed, 06/06/21, MD, [facility] 04/20/2022 2:54 PM    [facility]

## 2022-04-20 NOTE — Patient Instructions (Addendum)
Medication Instructions:  Your physician recommends that you continue on your current medications as directed. Please refer to the Current Medication list given to you today.  *If you need a refill on your cardiac medications before your next appointment, please call your pharmacy*   Lab Work:  Lipid panel - today 2nd floor- [address]  If you have labs (blood work) drawn today and your tests are completely normal, you will receive your results only by: MyChart Message (if you have MyChart) OR A paper copy in the mail If you have any lab test that is abnormal or we need to change your treatment, we will call you to review the results.   Testing/Procedures: Your physician has requested that you have an echocardiogram. Echocardiography is a painless test that uses sound waves to create images of your heart. It provides your doctor with information about the size and shape of your heart and how well your heart's chambers and valves are working. This procedure takes approximately one hour. There are no restrictions for this procedure.    Follow-Up: At [facility], you and your health needs are our priority.  As part of our continuing mission to provide you with exceptional heart care, we have created designated Provider Care Teams.  These Care Teams include your primary Cardiologist (physician) and Advanced Practice Providers (APPs -  Physician Assistants and Nurse Practitioners) who all work together to provide you with the care you need, when you need it.  We recommend signing up for the patient portal called "MyChart".  Sign up information is provided on this After Visit Summary.  MyChart is used to connect with patients for Virtual Visits (Telemedicine).  Patients are able to view lab/test results, encounter notes, upcoming appointments, etc.  Non-urgent messages can be sent to your provider as well.   To learn more about what you can do with MyChart, go to [email or web address].     Your next appointment:   6 month(s)  The format for your next appointment:   In Person  Provider:   Gypsy Balsam, MD    Other Instructions NA

## 2022-04-21 LAB — LIPID PANEL
Chol/HDL Ratio: 2.5 ratio (ref 0.0–4.4)
Cholesterol, Total: 136 mg/dL (ref 100–199)
HDL: 54 mg/dL (ref >39)
LDL Chol Calc (NIH): 67 mg/dL (ref 0–99)
Triglycerides: 77 mg/dL (ref 0–149)
VLDL Cholesterol Cal: 15 mg/dL (ref 5–40)

## 2022-04-22 ENCOUNTER — Telehealth: Payer: Self-pay

## 2022-04-22 NOTE — Telephone Encounter (Signed)
-----   Message from Georgeanna Lea, MD sent at 04/22/2022  9:09 AM EDT ----- Cholesterol looks good, continue present management

## 2022-04-22 NOTE — Telephone Encounter (Signed)
Patient notified of results.

## 2022-05-03 DIAGNOSIS — I129 Hypertensive chronic kidney disease with stage 1 through stage 4 chronic kidney disease, or unspecified chronic kidney disease: Secondary | ICD-10-CM | POA: Diagnosis not present

## 2022-05-03 DIAGNOSIS — I2510 Atherosclerotic heart disease of native coronary artery without angina pectoris: Secondary | ICD-10-CM | POA: Diagnosis not present

## 2022-05-03 DIAGNOSIS — M5136 Other intervertebral disc degeneration, lumbar region: Secondary | ICD-10-CM | POA: Diagnosis not present

## 2022-05-03 DIAGNOSIS — E559 Vitamin D deficiency, unspecified: Secondary | ICD-10-CM | POA: Diagnosis not present

## 2022-05-03 DIAGNOSIS — E1159 Type 2 diabetes mellitus with other circulatory complications: Secondary | ICD-10-CM | POA: Diagnosis not present

## 2022-05-10 DIAGNOSIS — Z0000 Encounter for general adult medical examination without abnormal findings: Secondary | ICD-10-CM | POA: Diagnosis not present

## 2022-05-10 DIAGNOSIS — N1831 Chronic kidney disease, stage 3a: Secondary | ICD-10-CM | POA: Diagnosis not present

## 2022-05-10 DIAGNOSIS — M5416 Radiculopathy, lumbar region: Secondary | ICD-10-CM | POA: Diagnosis not present

## 2022-05-10 DIAGNOSIS — I129 Hypertensive chronic kidney disease with stage 1 through stage 4 chronic kidney disease, or unspecified chronic kidney disease: Secondary | ICD-10-CM | POA: Diagnosis not present

## 2022-05-13 ENCOUNTER — Ambulatory Visit (HOSPITAL_BASED_OUTPATIENT_CLINIC_OR_DEPARTMENT_OTHER)
Admission: RE | Admit: 2022-05-13 | Discharge: 2022-05-13 | Disposition: A | Discharge status: Home / Self Care | Payer: Medicare Other | Source: Ambulatory Visit | Attending: Cardiology | Admitting: Cardiology

## 2022-05-13 DIAGNOSIS — I10 Essential (primary) hypertension: Secondary | ICD-10-CM | POA: Diagnosis not present

## 2022-05-13 DIAGNOSIS — I2510 Atherosclerotic heart disease of native coronary artery without angina pectoris: Secondary | ICD-10-CM | POA: Diagnosis not present

## 2022-05-13 DIAGNOSIS — R0609 Other forms of dyspnea: Secondary | ICD-10-CM

## 2022-05-13 NOTE — Progress Notes (Signed)
  Echocardiogram 2D Echocardiogram has been performed.  Kristina Singleton F 05/13/2022, 2:50 PM

## 2022-05-17 LAB — ECHOCARDIOGRAM COMPLETE
AR max vel: 2.1 cm2
AV Area VTI: 2.01 cm2
AV Area mean vel: 1.95 cm2
AV Mean grad: 6 mmHg
AV Peak grad: 11.4 mmHg
Ao pk vel: 1.69 m/s
Area-P 1/2: 2.63 cm2
S' Lateral: 3 cm

## 2022-05-19 ENCOUNTER — Telehealth: Payer: Self-pay | Admitting: Cardiology

## 2022-05-19 NOTE — Telephone Encounter (Signed)
Pt returning a call from Raymond, CMA to go over echo results.

## 2022-05-19 NOTE — Telephone Encounter (Signed)
Patient informed of results.  

## 2022-06-13 ENCOUNTER — Other Ambulatory Visit: Payer: Self-pay | Admitting: Cardiology

## 2022-07-13 DIAGNOSIS — Z1231 Encounter for screening mammogram for malignant neoplasm of breast: Secondary | ICD-10-CM | POA: Diagnosis not present

## 2022-09-14 ENCOUNTER — Other Ambulatory Visit: Payer: Self-pay | Admitting: Cardiology

## 2022-12-01 DIAGNOSIS — E559 Vitamin D deficiency, unspecified: Secondary | ICD-10-CM | POA: Diagnosis not present

## 2022-12-01 DIAGNOSIS — R946 Abnormal results of thyroid function studies: Secondary | ICD-10-CM | POA: Diagnosis not present

## 2022-12-01 DIAGNOSIS — Z79899 Other long term (current) drug therapy: Secondary | ICD-10-CM | POA: Diagnosis not present

## 2022-12-01 DIAGNOSIS — E119 Type 2 diabetes mellitus without complications: Secondary | ICD-10-CM | POA: Diagnosis not present

## 2022-12-01 DIAGNOSIS — I129 Hypertensive chronic kidney disease with stage 1 through stage 4 chronic kidney disease, or unspecified chronic kidney disease: Secondary | ICD-10-CM | POA: Diagnosis not present

## 2022-12-07 DIAGNOSIS — D649 Anemia, unspecified: Secondary | ICD-10-CM | POA: Diagnosis not present

## 2022-12-07 DIAGNOSIS — I2510 Atherosclerotic heart disease of native coronary artery without angina pectoris: Secondary | ICD-10-CM | POA: Diagnosis not present

## 2022-12-07 DIAGNOSIS — I129 Hypertensive chronic kidney disease with stage 1 through stage 4 chronic kidney disease, or unspecified chronic kidney disease: Secondary | ICD-10-CM | POA: Diagnosis not present

## 2022-12-07 DIAGNOSIS — K219 Gastro-esophageal reflux disease without esophagitis: Secondary | ICD-10-CM | POA: Diagnosis not present

## 2022-12-13 ENCOUNTER — Ambulatory Visit: Payer: 59 | Admitting: Cardiology

## 2023-01-04 ENCOUNTER — Ambulatory Visit: Payer: 59 | Attending: Cardiology | Admitting: Cardiology

## 2023-01-04 VITALS — BP 144/82 | HR 69 | Ht 66.0 in | Wt 226.0 lb

## 2023-01-04 DIAGNOSIS — E782 Mixed hyperlipidemia: Secondary | ICD-10-CM | POA: Diagnosis not present

## 2023-01-04 DIAGNOSIS — I2510 Atherosclerotic heart disease of native coronary artery without angina pectoris: Secondary | ICD-10-CM

## 2023-01-04 DIAGNOSIS — I10 Essential (primary) hypertension: Secondary | ICD-10-CM | POA: Diagnosis not present

## 2023-01-04 NOTE — Progress Notes (Signed)
Cardiology Office Note:    Date:  01/04/2023   ID:  Kristina Singleton, DOB 03/23/1951, MRN [id number]  PCP:  Kristina Morning, DO  Cardiologist:  Kristina Campus, MD    Referring MD: Kristina Morning, DO   Chief Complaint  Patient presents with   Follow-up    History of Present Illness:    Kristina Singleton is a 72 y.o. female history significant for coronary disease.  In 2003 she did require PTCA and stenting of unknown artery, essential hypertension, COPD, dyslipidemia, obesity, sleep apnea.  She comes today to months for follow-up she is doing terrific she lost significant amount of weight after being put on Ozempic.  She denies have any chest pain tightness squeezing pressure burning chest no palpitations dizziness swelling of lower extremities.  Past Medical History:  Diagnosis Date   Abnormal results of thyroid function studies 04/01/2021   Acute right ankle pain 08/16/2018   Anemia 04/01/2021   Arthritis    BPPV (benign paroxysmal positional vertigo) 02/01/2016   CAD (coronary artery disease)    with stent placement   CAD (coronary artery disease), native coronary artery 02/01/2016   PTCA and stenting of unknown artery in [place] in 2003   Chronic pain    Constipation 11/03/2019   COPD (chronic obstructive pulmonary disease) ([place])    Coronary artery disease    with stent placement   Diabetes mellitus without complication ([place])    doesn't have   Dyslipidemia 02/01/2016   Elevated liver enzymes 08/23/2017   Family history of colon cancer    Fibromyalgia    Gastroesophageal reflux disease    Gastroesophageal reflux disease without esophagitis 02/01/2016   GERD (gastroesophageal reflux disease)    Hypercholesterolemia 08/23/2017   Hyperlipidemia    Hypertension    Hypokalemia 08/23/2017   Lower back pain    due to OA   Lower extremity edema 08/23/2017   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   Morbid obesity ([place]) 02/01/2016   Obesity    Obesity    Opioid  dependence ([place]) 08/23/2017   OSA (obstructive sleep apnea) 10/28/2019   Other allergy status, other than to drugs and biological substances 04/01/2021   Personal history of colonic polyps    Pes planus, congenital, 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   Primary osteoarthritis of right ankle 08/29/2018   Rectal bleeding 11/03/2019   Right calf pain 01/07/2019   Right foot pain 08/16/2018   Sleep apnea    Stress fracture of navicular bone of left foot 10/23/2018   Type 2 diabetes mellitus without complication, without long-term current use of insulin ([place]) 08/23/2017   Type II diabetes mellitus ([place])    doesn't have    Past Surgical History:  Procedure Laterality Date   ARTHRODESIS     CARDIAC CATHETERIZATION     CESAREAN SECTION     CHOLECYSTECTOMY     CHOLECYSTECTOMY, LAPAROSCOPIC     COLONOSCOPY  09/17/2014   Colonic polyps status postr polypectomy. Mild sigmoid diverticulosis. Limited examination of the right side of the colon due to the quality of preparation.    CORONARY ANGIOPLASTY     ESOPHAGOGASTRODUODENOSCOPY  07/17/2013   Small hiatal hernia. Healed esophageal erosions (biopsied)    FOOT SURGERY Left 2017   and in 2018 had to restructue it again   HERNIA REPAIR  2012   HYSTEROSCOPY     REPAIR NONUNION / MALUNION METATARSAL / TARSAL BONES     REPLACEMENT TOTAL KNEE  2016   SHOULDER ARTHROSCOPY  Current Medications: Current Meds  Medication Sig   albuterol (VENTOLIN HFA) 108 (90 Base) MCG/ACT inhaler Inhale 2 puffs into the lungs every 6 (six) hours as needed for wheezing or shortness of breath.   AMBULATORY NON FORMULARY MEDICATION Take 30 drops by mouth 2 (two) times daily. CBD oil   atorvastatin (LIPITOR) 40 MG tablet Take 1 tablet (40 mg total) by mouth daily.   cloNIDine (CATAPRES) 0.3 MG tablet Take 0.3 mg by mouth at bedtime.   clopidogrel (PLAVIX) 75 MG tablet Take 1 tablet (75 mg total) by mouth daily.   cyclobenzaprine (FLEXERIL) 10 MG tablet Take 1 tablet by mouth 2 (two) times  daily.    ezetimibe (ZETIA) 10 MG tablet Take 1 tablet (10 mg total) by mouth daily.   famotidine (PEPCID) 40 MG tablet Take 1 tablet by mouth at bedtime.   fluticasone (FLONASE) 50 MCG/ACT nasal spray Place 2 sprays into both nostrils 2 (two) times daily.   hydrALAZINE (APRESOLINE) 25 MG tablet Take 25 mg by mouth 3 (three) times daily.   meclizine (ANTIVERT) 25 MG tablet Take 25-50 mg by mouth 2 (two) times daily as needed for dizziness.   NAPROXEN PO Take 2 tablets by mouth as needed (pain). Unknown strength   nitroGLYCERIN (NITROSTAT) 0.4 MG SL tablet Place 1 tablet (0.4 mg total) under the tongue as needed for chest pain.   olmesartan (BENICAR) 40 MG tablet Take 40 mg by mouth daily.   olopatadine (PATANOL) 0.1 % ophthalmic solution Place 2 drops into both eyes as needed for allergies.   OZEMPIC, 0.25 OR 0.5 MG/DOSE, 2 MG/1.5ML SOPN Inject 0.25 mg into the skin daily.   polyethylene glycol powder (GLYCOLAX/MIRALAX) 17 GM/SCOOP powder Take 1 g by mouth daily.   Vitamin D, Ergocalciferol, (DRISDOL) 1.25 MG (50000 UNIT) CAPS capsule Take 50,000 Units by mouth once a week.     Allergies:   Azithromycin, Celecoxib, Morphine, Duloxetine, Erythromycin base, and Lisinopril   Social History   Socioeconomic History   Marital status: Married    Spouse name: Not on file   Number of children: 2   Years of education: Not on file   Highest education level: Not on file  Occupational History   Occupation: Retired   Tobacco Use   Smoking status: Former    Types: Cigarettes    Quit date: [place]    Years since quitting: 35.1   Smokeless tobacco: Never  Vaping Use   Vaping Use: Some days  Substance and Sexual Activity   Alcohol use: Yes    Alcohol/week: 2.0 standard drinks of alcohol    Types: 2 Glasses of wine per week   Drug use: Never   Sexual activity: Not on file  Other Topics Concern   Not on file  Social History Narrative   Not on file   Social Determinants of Health   Financial  Resource Strain: Not on file  Food Insecurity: Not on file  Transportation Needs: Not on file  Physical Activity: Not on file  Stress: Not on file  Social Connections: Not on file     Family History: The patient's family history includes Arthritis in her daughter; CAD in her mother; Colon cancer in her sister; Heart attack in her father; Heart disease in her mother; Hypertension in her father and mother. There is no history of Breast cancer, Esophageal cancer, Stomach cancer, or Rectal cancer. ROS:   Please see the history of present illness.    All 14 point review of  systems negative except as described per history of present illness  EKGs/Labs/Other Studies Reviewed:      Recent Labs: No results found for requested labs within last 365 days.  Recent Lipid Panel    Component Value Date/Time   CHOL 136 04/20/2022 1526   TRIG 77 04/20/2022 1526   HDL 54 04/20/2022 1526   CHOLHDL 2.5 04/20/2022 1526   LDLCALC 67 04/20/2022 1526    Physical Exam:    VS:  BP (!) 144/82 (BP Location: Left Arm, Patient Position: Sitting)   Pulse 69   Ht 5' 6"$  (1.676 m)   Wt 226 lb (102.5 kg)   SpO2 98%   BMI 36.48 kg/m     Wt Readings from Last 3 Encounters:  01/04/23 226 lb (102.5 kg)  04/20/22 237 lb (107.5 kg)  10/07/21 260 lb (117.9 kg)     GEN:  Well nourished, well developed in no acute distress HEENT: Normal NECK: No JVD; No carotid bruits LYMPHATICS: No lymphadenopathy CARDIAC: RRR, no murmurs, no rubs, no gallops RESPIRATORY:  Clear to auscultation without rales, wheezing or rhonchi  ABDOMEN: Soft, non-tender, non-distended MUSCULOSKELETAL:  No edema; No deformity  SKIN: Warm and dry LOWER EXTREMITIES: no swelling NEUROLOGIC:  Alert and oriented x 3 PSYCHIATRIC:  Normal affect   ASSESSMENT:    1. Coronary artery disease involving native coronary artery of native heart without angina pectoris   2. Primary hypertension   3. Mixed hyperlipidemia    PLAN:    In order  of problems listed above:  Coronary disease stable from that point to an appropriate medication which I will continue. Dyslipidemia I did review K PN which show me data from December 01, 2022 LDL 78 HDL 62 good cholesterol profile continue present medication which include high intense statin for of Lipitor. Essential hypertension blood pressure slightly elevated today but she said it is always good at home she simply nervous today. Overall she is doing very well and I congratulated her for weight loss.  Encouraged her to be more active.   Medication Adjustments/Labs and Tests Ordered: Current medicines are reviewed at length with the patient today.  Concerns regarding medicines are outlined above.  No orders of the defined types were placed in this encounter.  Medication changes: No orders of the defined types were placed in this encounter.   Signed, Park Liter, MD, [facility] 01/04/2023 3:15 PM    [place]

## 2023-01-04 NOTE — Patient Instructions (Signed)

## 2023-02-28 ENCOUNTER — Other Ambulatory Visit: Payer: Self-pay | Admitting: Cardiology

## 2023-03-29 ENCOUNTER — Ambulatory Visit: Payer: 59 | Attending: Cardiology | Admitting: Cardiology

## 2023-03-29 ENCOUNTER — Encounter: Payer: Self-pay | Admitting: Cardiology

## 2023-03-29 VITALS — BP 160/90 | HR 72 | Ht 65.5 in | Wt 234.8 lb

## 2023-03-29 DIAGNOSIS — I2510 Atherosclerotic heart disease of native coronary artery without angina pectoris: Secondary | ICD-10-CM | POA: Diagnosis not present

## 2023-03-29 DIAGNOSIS — I10 Essential (primary) hypertension: Secondary | ICD-10-CM | POA: Diagnosis not present

## 2023-03-29 DIAGNOSIS — E785 Hyperlipidemia, unspecified: Secondary | ICD-10-CM | POA: Diagnosis not present

## 2023-03-29 DIAGNOSIS — R0789 Other chest pain: Secondary | ICD-10-CM

## 2023-03-29 LAB — TROPONIN T: Troponin T (Highly Sensitive): <6 ng/L (ref 0–14)

## 2023-03-29 NOTE — Patient Instructions (Signed)
Medication Instructions:  Your physician recommends that you continue on your current medications as directed. Please refer to the Current Medication list given to you today.  *If you need a refill on your cardiac medications before your next appointment, please call your pharmacy*   Lab Work: Troponin- stat If you have labs (blood work) drawn today and your tests are completely normal, you will receive your results only by: MyChart Message (if you have MyChart) OR A paper copy in the mail If you have any lab test that is abnormal or we need to change your treatment, we will call you to review the results.   Testing/Procedures: Your physician has requested that you have a lexiscan myoview. For further information please visit [email or web address] Please follow instruction sheet, as given.  The test will take approximately 3 to 4 hours to complete; you may bring reading material.  If someone comes with you to your appointment, they will need to remain in the main lobby due to limited space in the testing area.  How to prepare for your Myocardial Perfusion Test: Do not eat or drink 3 hours prior to your test, except you may have water. Do not consume products containing caffeine (regular or decaffeinated) 12 hours prior to your test. (ex: coffee, chocolate, sodas, tea). Do bring a list of your current medications with you.  If not listed below, you may take your medications as normal. Do wear comfortable clothes (no dresses or overalls) and walking shoes, tennis shoes preferred (No heels or open toe shoes are allowed). Do NOT wear cologne, perfume, aftershave, or lotions (deodorant is allowed). If these instructions are not followed, your test will have to be rescheduled.     Follow-Up: At [facility], you and your health needs are our priority.  As part of our continuing mission to provide you with exceptional heart care, we have created designated Provider Care Teams.  These Care Teams  include your primary Cardiologist (physician) and Advanced Practice Providers (APPs -  Physician Assistants and Nurse Practitioners) who all work together to provide you with the care you need, when you need it.  We recommend signing up for the patient portal called "MyChart".  Sign up information is provided on this After Visit Summary.  MyChart is used to connect with patients for Virtual Visits (Telemedicine).  Patients are able to view lab/test results, encounter notes, upcoming appointments, etc.  Non-urgent messages can be sent to your provider as well.   To learn more about what you can do with MyChart, go to [email or web address].    Your next appointment:   6 month(s)  The format for your next appointment:   In Person  Provider:   Gypsy Balsam, MD    Other Instructions NA

## 2023-03-29 NOTE — Progress Notes (Signed)
Cardiology Office Note:    Date:  03/29/2023   ID:  Kristina Singleton, DOB January 27, 1951, MRN [id number]  PCP:  Kristina Reichmann, DO  Cardiologist:  Gypsy Balsam, MD    Referring MD: Kristina Reichmann, DO   Chief Complaint  Patient presents with   Chest Pain    3 weeks ongoing, denies other sx's    History of Present Illness:    Kristina Singleton is a 72 y.o. female with past medical history significant for coronary artery disease.  In 2003 she required PTCA and stenting of unknown artery, additional problem include essential hypertension, diabetes, COPD, dyslipidemia, obesity, sleep apnea. She requested to be seen because for last 3 weeks has been experiencing pain in the left side of her chest happening anytime it wants to last about a few minutes she graded sensation at 4-5, moving her shoulder and twisting her body make the sensation will be worse.  There is no sweating no shortness of breath associated with this sensation, there is no skin changes.  Exercises walking does not provoke the pain.  Past Medical History:  Diagnosis Date   Abnormal results of thyroid function studies 04/01/2021   Acute right ankle pain 08/16/2018   Anemia 04/01/2021   Arthritis    BPPV (benign paroxysmal positional vertigo) 02/01/2016   CAD (coronary artery disease)    with stent placement   CAD (coronary artery disease), native coronary artery 02/01/2016   PTCA and stenting of unknown artery in [place] in 2003   Chronic pain    Constipation 11/03/2019   COPD (chronic obstructive pulmonary disease) (HCC)    Coronary artery disease    with stent placement   Diabetes mellitus without complication (HCC)    doesn't have   Dyslipidemia 02/01/2016   Elevated liver enzymes 08/23/2017   Family history of colon cancer    Fibromyalgia    Gastroesophageal reflux disease    Gastroesophageal reflux disease without esophagitis 02/01/2016   GERD (gastroesophageal reflux disease)    Hypercholesterolemia 08/23/2017    Hyperlipidemia    Hypertension    Hypokalemia 08/23/2017   Lower back pain    due to OA   Lower extremity edema 08/23/2017   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   Morbid obesity (HCC) 02/01/2016   Obesity    Obesity    Opioid dependence (HCC) 08/23/2017   OSA (obstructive sleep apnea) 10/28/2019   Other allergy status, other than to drugs and biological substances 04/01/2021   Personal history of colonic polyps    Pes planus, congenital, 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   Primary osteoarthritis of right ankle 08/29/2018   Rectal bleeding 11/03/2019   Right calf pain 01/07/2019   Right foot pain 08/16/2018   Sleep apnea    Stress fracture of navicular bone of left foot 10/23/2018   Type 2 diabetes mellitus without complication, without long-term current use of insulin (HCC) 08/23/2017   Type II diabetes mellitus (HCC)    doesn't have    Past Surgical History:  Procedure Laterality Date   ARTHRODESIS     CARDIAC CATHETERIZATION     CESAREAN SECTION     CHOLECYSTECTOMY     CHOLECYSTECTOMY, LAPAROSCOPIC     COLONOSCOPY  09/17/2014   Colonic polyps status postr polypectomy. Mild sigmoid diverticulosis. Limited examination of the right side of the colon due to the quality of preparation.    CORONARY ANGIOPLASTY     ESOPHAGOGASTRODUODENOSCOPY  07/17/2013   Small hiatal hernia. Healed esophageal erosions (biopsied)  FOOT SURGERY Left 2017   and in 2018 had to restructue it again   HERNIA REPAIR  2012   HYSTEROSCOPY     REPAIR NONUNION / MALUNION METATARSAL / TARSAL BONES     REPLACEMENT TOTAL KNEE  2016   SHOULDER ARTHROSCOPY      Current Medications: Current Meds  Medication Sig   albuterol (VENTOLIN HFA) 108 (90 Base) MCG/ACT inhaler Inhale 2 puffs into the lungs every 6 (six) hours as needed for wheezing or shortness of breath.   AMBULATORY NON FORMULARY MEDICATION Take 30 drops by mouth 2 (two) times daily. CBD oil   atorvastatin (LIPITOR) 40 MG tablet Take 1 tablet (40 mg  total) by mouth daily.   cloNIDine (CATAPRES) 0.3 MG tablet Take 0.3 mg by mouth at bedtime.   clopidogrel (PLAVIX) 75 MG tablet Take 1 tablet (75 mg total) by mouth daily.   cyclobenzaprine (FLEXERIL) 10 MG tablet Take 1 tablet by mouth 2 (two) times daily.    ezetimibe (ZETIA) 10 MG tablet TAKE ONE TABLET (10mg  total) BY MOUTH DAILY (Patient taking differently: Take 10 mg by mouth daily.)   famotidine (PEPCID) 40 MG tablet Take 1 tablet by mouth at bedtime.   fluticasone (FLONASE) 50 MCG/ACT nasal spray Place 2 sprays into both nostrils 2 (two) times daily.   hydrALAZINE (APRESOLINE) 25 MG tablet Take 25 mg by mouth 3 (three) times daily.   meclizine (ANTIVERT) 25 MG tablet Take 25-50 mg by mouth 2 (two) times daily as needed for dizziness.   NAPROXEN PO Take 2 tablets by mouth as needed (pain). Unknown strength   nitroGLYCERIN (NITROSTAT) 0.4 MG SL tablet Place 1 tablet (0.4 mg total) under the tongue as needed for chest pain.   olmesartan (BENICAR) 40 MG tablet Take 40 mg by mouth daily.   olopatadine (PATANOL) 0.1 % ophthalmic solution Place 2 drops into both eyes as needed for allergies.   OZEMPIC, 0.25 OR 0.5 MG/DOSE, 2 MG/1.5ML SOPN Inject 0.25 mg into the skin daily.   polyethylene glycol powder (GLYCOLAX/MIRALAX) 17 GM/SCOOP powder Take 1 g by mouth daily.   Vitamin D, Ergocalciferol, (DRISDOL) 1.25 MG (50000 UNIT) CAPS capsule Take 50,000 Units by mouth once a week.     Allergies:   Azithromycin, Celecoxib, Morphine, Duloxetine, Erythromycin base, and Lisinopril   Social History   Socioeconomic History   Marital status: Married    Spouse name: Not on file   Number of children: 2   Years of education: Not on file   Highest education level: Not on file  Occupational History   Occupation: Retired   Tobacco Use   Smoking status: Former    Types: Cigarettes    Quit date: 1989    Years since quitting: 35.3   Smokeless tobacco: Never  Vaping Use   Vaping Use: Some days   Substance and Sexual Activity   Alcohol use: Yes    Alcohol/week: 2.0 standard drinks of alcohol    Types: 2 Glasses of wine per week   Drug use: Never   Sexual activity: Not on file  Other Topics Concern   Not on file  Social History Narrative   Not on file   Social Determinants of Health   Financial Resource Strain: Not on file  Food Insecurity: Not on file  Transportation Needs: Not on file  Physical Activity: Not on file  Stress: Not on file  Social Connections: Not on file     Family History: The patient's family history includes  Arthritis in her daughter; CAD in her mother; Colon cancer in her sister; Heart attack in her father; Heart disease in her mother; Hypertension in her father and mother. There is no history of Breast cancer, Esophageal cancer, Stomach cancer, or Rectal cancer. ROS:   Please see the history of present illness.    All 14 point review of systems negative except as described per history of present illness  EKGs/Labs/Other Studies Reviewed:      Recent Labs: No results found for requested labs within last 365 days.  Recent Lipid Panel    Component Value Date/Time   CHOL 136 04/20/2022 1526   TRIG 77 04/20/2022 1526   HDL 54 04/20/2022 1526   CHOLHDL 2.5 04/20/2022 1526   LDLCALC 67 04/20/2022 1526    Physical Exam:    VS:  BP (!) 160/90 (BP Location: Left Arm, Patient Position: Sitting)   Pulse 72   Ht 5' 5.5" (1.664 m)   Wt 234 lb 12.8 oz (106.5 kg)   SpO2 94%   BMI 38.48 kg/m     Wt Readings from Last 3 Encounters:  03/29/23 234 lb 12.8 oz (106.5 kg)  01/04/23 226 lb (102.5 kg)  04/20/22 237 lb (107.5 kg)     GEN:  Well nourished, well developed in no acute distress HEENT: Normal NECK: No JVD; No carotid bruits LYMPHATICS: No lymphadenopathy CARDIAC: RRR, no murmurs, no rubs, no gallops RESPIRATORY:  Clear to auscultation without rales, wheezing or rhonchi  ABDOMEN: Soft, non-tender, non-distended MUSCULOSKELETAL:  No  edema; No deformity  SKIN: Warm and dry LOWER EXTREMITIES: no swelling NEUROLOGIC:  Alert and oriented x 3 PSYCHIATRIC:  Normal affect   ASSESSMENT:    1. Atypical chest pain   2. Coronary artery disease involving native coronary artery of native heart without angina pectoris   3. Primary hypertension   4. Dyslipidemia    PLAN:    In order of problems listed above:  Coronary artery disease.  She comes today to my office with atypical chest pain however because of her multiple risk factors for coronary artery disease I will ask her to have troponin I done.  I also will schedule her to have Lexiscan.  I will not alter any of her medications. Essential hypertension.  Her blood pressure is elevated today uncontrolled I wanted to increase the dose of medication however she tells me when she checked her blood pressure at home is always 120/70, therefore, we will continue present management. Dyslipidemia I did review her K PN which show me her LDL of 78 HDL 62 this is from December 01, 2022.  She is taking Zetia as well as Lipitor 40 which I will continue.   Medication Adjustments/Labs and Tests Ordered: Current medicines are reviewed at length with the patient today.  Concerns regarding medicines are outlined above.  No orders of the defined types were placed in this encounter.  Medication changes: No orders of the defined types were placed in this encounter.   Signed, Georgeanna Lea, MD, [facility] 03/29/2023 1:50 PM    [facility]

## 2023-03-29 NOTE — Addendum Note (Signed)
Addended by: Baldo Ash D on: 03/29/2023 02:01 PM   Modules accepted: Orders

## 2023-03-31 ENCOUNTER — Telehealth: Payer: Self-pay

## 2023-03-31 NOTE — Telephone Encounter (Signed)
-----   Message from Georgeanna Lea, MD sent at 03/30/2023  8:41 AM EDT ----- Troponin I negative

## 2023-03-31 NOTE — Telephone Encounter (Signed)
Patient notified through my chart.

## 2023-04-05 NOTE — Addendum Note (Signed)
Addended by: Gypsy Balsam on: 04/05/2023 09:39 AM   Modules accepted: Orders

## 2023-04-12 ENCOUNTER — Ambulatory Visit: Payer: 59 | Attending: Cardiology

## 2023-04-12 DIAGNOSIS — R0789 Other chest pain: Secondary | ICD-10-CM

## 2023-04-12 LAB — MYOCARDIAL PERFUSION IMAGING: Stress Nuclear Isotope Dose: 32.8 mCi

## 2023-04-12 MED ORDER — REGADENOSON 0.4 MG/5ML IV SOLN
0.4000 mg | Freq: Once | INTRAVENOUS | Status: AC
Start: 2023-04-12 — End: 2023-04-12
  Administered 2023-04-12: 0.4 mg via INTRAVENOUS

## 2023-04-12 MED ORDER — TECHNETIUM TC 99M TETROFOSMIN IV KIT
32.8000 | PACK | Freq: Once | INTRAVENOUS | Status: AC | PRN
  Administered 2023-04-12: 32.8 via INTRAVENOUS

## 2023-04-13 ENCOUNTER — Ambulatory Visit: Payer: 59 | Attending: Cardiology

## 2023-04-13 LAB — MYOCARDIAL PERFUSION IMAGING
LV dias vol: 55 mL (ref 46–106)
LV sys vol: 16 mL
Nuc Stress EF: 70 %
Peak HR: 82 {beats}/min
Rest HR: 66 {beats}/min
Rest Nuclear Isotope Dose: 29.9 mCi
SDS: 0
SRS: 0
SSS: 0
ST Depression (mm): 0 mm
TID: 0.73

## 2023-04-13 MED ORDER — TECHNETIUM TC 99M TETROFOSMIN IV KIT
29.9000 | PACK | Freq: Once | INTRAVENOUS | Status: AC | PRN
  Administered 2023-04-13: 29.9 via INTRAVENOUS

## 2023-04-25 DIAGNOSIS — M5416 Radiculopathy, lumbar region: Secondary | ICD-10-CM | POA: Diagnosis not present

## 2023-04-25 DIAGNOSIS — M9901 Segmental and somatic dysfunction of cervical region: Secondary | ICD-10-CM | POA: Diagnosis not present

## 2023-04-25 DIAGNOSIS — M9902 Segmental and somatic dysfunction of thoracic region: Secondary | ICD-10-CM | POA: Diagnosis not present

## 2023-04-25 DIAGNOSIS — M531 Cervicobrachial syndrome: Secondary | ICD-10-CM | POA: Diagnosis not present

## 2023-04-25 DIAGNOSIS — M9903 Segmental and somatic dysfunction of lumbar region: Secondary | ICD-10-CM | POA: Diagnosis not present

## 2023-04-26 ENCOUNTER — Telehealth: Payer: Self-pay

## 2023-04-26 NOTE — Telephone Encounter (Signed)
Left message on My Chart with normal results per Dr. Krasowski's note. Routed to PCP. 

## 2023-05-02 DIAGNOSIS — M5416 Radiculopathy, lumbar region: Secondary | ICD-10-CM | POA: Diagnosis not present

## 2023-05-02 DIAGNOSIS — M531 Cervicobrachial syndrome: Secondary | ICD-10-CM | POA: Diagnosis not present

## 2023-05-02 DIAGNOSIS — M9903 Segmental and somatic dysfunction of lumbar region: Secondary | ICD-10-CM | POA: Diagnosis not present

## 2023-05-02 DIAGNOSIS — M9902 Segmental and somatic dysfunction of thoracic region: Secondary | ICD-10-CM | POA: Diagnosis not present

## 2023-05-02 DIAGNOSIS — M9901 Segmental and somatic dysfunction of cervical region: Secondary | ICD-10-CM | POA: Diagnosis not present

## 2023-05-05 DIAGNOSIS — M9901 Segmental and somatic dysfunction of cervical region: Secondary | ICD-10-CM | POA: Diagnosis not present

## 2023-05-05 DIAGNOSIS — M5416 Radiculopathy, lumbar region: Secondary | ICD-10-CM | POA: Diagnosis not present

## 2023-05-05 DIAGNOSIS — M9903 Segmental and somatic dysfunction of lumbar region: Secondary | ICD-10-CM | POA: Diagnosis not present

## 2023-05-05 DIAGNOSIS — M9902 Segmental and somatic dysfunction of thoracic region: Secondary | ICD-10-CM | POA: Diagnosis not present

## 2023-05-05 DIAGNOSIS — M531 Cervicobrachial syndrome: Secondary | ICD-10-CM | POA: Diagnosis not present

## 2023-05-09 DIAGNOSIS — M9901 Segmental and somatic dysfunction of cervical region: Secondary | ICD-10-CM | POA: Diagnosis not present

## 2023-05-09 DIAGNOSIS — M9902 Segmental and somatic dysfunction of thoracic region: Secondary | ICD-10-CM | POA: Diagnosis not present

## 2023-05-09 DIAGNOSIS — M9903 Segmental and somatic dysfunction of lumbar region: Secondary | ICD-10-CM | POA: Diagnosis not present

## 2023-05-09 DIAGNOSIS — M5416 Radiculopathy, lumbar region: Secondary | ICD-10-CM | POA: Diagnosis not present

## 2023-05-09 DIAGNOSIS — M531 Cervicobrachial syndrome: Secondary | ICD-10-CM | POA: Diagnosis not present

## 2023-05-16 DIAGNOSIS — M5416 Radiculopathy, lumbar region: Secondary | ICD-10-CM | POA: Diagnosis not present

## 2023-05-16 DIAGNOSIS — M9901 Segmental and somatic dysfunction of cervical region: Secondary | ICD-10-CM | POA: Diagnosis not present

## 2023-05-16 DIAGNOSIS — M9902 Segmental and somatic dysfunction of thoracic region: Secondary | ICD-10-CM | POA: Diagnosis not present

## 2023-05-16 DIAGNOSIS — M9903 Segmental and somatic dysfunction of lumbar region: Secondary | ICD-10-CM | POA: Diagnosis not present

## 2023-05-16 DIAGNOSIS — M531 Cervicobrachial syndrome: Secondary | ICD-10-CM | POA: Diagnosis not present

## 2023-06-06 DIAGNOSIS — E118 Type 2 diabetes mellitus with unspecified complications: Secondary | ICD-10-CM | POA: Diagnosis not present

## 2023-06-06 DIAGNOSIS — N1831 Chronic kidney disease, stage 3a: Secondary | ICD-10-CM | POA: Diagnosis not present

## 2023-06-06 DIAGNOSIS — R946 Abnormal results of thyroid function studies: Secondary | ICD-10-CM | POA: Diagnosis not present

## 2023-06-13 DIAGNOSIS — M5416 Radiculopathy, lumbar region: Secondary | ICD-10-CM | POA: Diagnosis not present

## 2023-06-13 DIAGNOSIS — N1831 Chronic kidney disease, stage 3a: Secondary | ICD-10-CM | POA: Diagnosis not present

## 2023-06-13 DIAGNOSIS — I129 Hypertensive chronic kidney disease with stage 1 through stage 4 chronic kidney disease, or unspecified chronic kidney disease: Secondary | ICD-10-CM | POA: Diagnosis not present

## 2023-06-13 DIAGNOSIS — Z0000 Encounter for general adult medical examination without abnormal findings: Secondary | ICD-10-CM | POA: Diagnosis not present

## 2023-11-25 ENCOUNTER — Other Ambulatory Visit: Payer: Self-pay | Admitting: Cardiology

## 2023-12-12 DIAGNOSIS — I2510 Atherosclerotic heart disease of native coronary artery without angina pectoris: Secondary | ICD-10-CM | POA: Diagnosis not present

## 2023-12-12 DIAGNOSIS — I129 Hypertensive chronic kidney disease with stage 1 through stage 4 chronic kidney disease, or unspecified chronic kidney disease: Secondary | ICD-10-CM | POA: Diagnosis not present

## 2023-12-12 DIAGNOSIS — N1831 Chronic kidney disease, stage 3a: Secondary | ICD-10-CM | POA: Diagnosis not present

## 2023-12-12 DIAGNOSIS — K219 Gastro-esophageal reflux disease without esophagitis: Secondary | ICD-10-CM | POA: Diagnosis not present

## 2023-12-12 DIAGNOSIS — E1159 Type 2 diabetes mellitus with other circulatory complications: Secondary | ICD-10-CM | POA: Diagnosis not present

## 2024-01-02 DIAGNOSIS — I129 Hypertensive chronic kidney disease with stage 1 through stage 4 chronic kidney disease, or unspecified chronic kidney disease: Secondary | ICD-10-CM | POA: Diagnosis not present

## 2024-01-02 DIAGNOSIS — I2510 Atherosclerotic heart disease of native coronary artery without angina pectoris: Secondary | ICD-10-CM | POA: Diagnosis not present

## 2024-01-02 DIAGNOSIS — E1159 Type 2 diabetes mellitus with other circulatory complications: Secondary | ICD-10-CM | POA: Diagnosis not present

## 2024-01-02 DIAGNOSIS — N1832 Chronic kidney disease, stage 3b: Secondary | ICD-10-CM | POA: Diagnosis not present

## 2024-03-05 ENCOUNTER — Ambulatory Visit: Attending: Cardiology | Admitting: Cardiology

## 2024-03-05 ENCOUNTER — Encounter: Payer: Self-pay | Admitting: Cardiology

## 2024-03-05 VITALS — BP 150/82 | HR 95 | Ht 66.0 in | Wt 240.0 lb

## 2024-03-05 DIAGNOSIS — G4733 Obstructive sleep apnea (adult) (pediatric): Secondary | ICD-10-CM

## 2024-03-05 DIAGNOSIS — E785 Hyperlipidemia, unspecified: Secondary | ICD-10-CM

## 2024-03-05 DIAGNOSIS — I10 Essential (primary) hypertension: Secondary | ICD-10-CM

## 2024-03-05 DIAGNOSIS — R0789 Other chest pain: Secondary | ICD-10-CM | POA: Diagnosis not present

## 2024-03-05 DIAGNOSIS — I2510 Atherosclerotic heart disease of native coronary artery without angina pectoris: Secondary | ICD-10-CM | POA: Diagnosis not present

## 2024-03-05 NOTE — Progress Notes (Signed)
 Cardiology Office Note:    Date:  03/05/2024   ID:  Kristina Singleton, DOB [birth year], MRN [id number]  PCP:  Irena Reichmann, DO  Cardiologist:  Gypsy Balsam, MD    Referring MD: Irena Reichmann, DO   Chief Complaint  Patient presents with   Fatigue   Left arm numbness    History of Present Illness:    Kristina Singleton is a 73 y.o. female past medical history significant for coronary artery disease in 2003 she did have PTCA and stenting of unknown artery, additional problem include essential hypertension, diabetes, COPD, dyslipidemia, obesity, sleep apnea. Comes today 2 months for follow-up overall she is doing well.  She is taking care of her husband who does have some neurological issues and is really required a lot of attention.  Denies have any chest pain tightness squeezing pressure burning chest, last time I talked to her at the end of seeing her she was having chest pain stress test done thereafter showed no evidence of ischemia.  Overall she is doing well she is busy taking care of her husband  Past Medical History:  Diagnosis Date   Abnormal results of thyroid function studies 04/01/2021   Acute right ankle pain 08/16/2018   Anemia 04/01/2021   Arthritis    BPPV (benign paroxysmal positional vertigo) 02/01/2016   CAD (coronary artery disease)    with stent placement   CAD (coronary artery disease), native coronary artery 02/01/2016   PTCA and stenting of unknown artery in [place] in 2003   Chronic pain    Constipation 11/03/2019   COPD (chronic obstructive pulmonary disease) (HCC)    Coronary artery disease    with stent placement   Diabetes mellitus without complication (HCC)    doesn't have   Dyslipidemia 02/01/2016   Elevated liver enzymes 08/23/2017   Family history of colon cancer    Fibromyalgia    Gastroesophageal reflux disease    Gastroesophageal reflux disease without esophagitis 02/01/2016   GERD (gastroesophageal reflux disease)    Hypercholesterolemia  08/23/2017   Hyperlipidemia    Hypertension    Hypokalemia 08/23/2017   Lower back pain    due to OA   Lower extremity edema 08/23/2017   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   Morbid obesity (HCC) 02/01/2016   Obesity    Obesity    Opioid dependence (HCC) 08/23/2017   OSA (obstructive sleep apnea) 10/28/2019   Other allergy status, other than to drugs and biological substances 04/01/2021   Personal history of colonic polyps    Pes planus, congenital, 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   Primary osteoarthritis of right ankle 08/29/2018   Rectal bleeding 11/03/2019   Right calf pain 01/07/2019   Right foot pain 08/16/2018   Sleep apnea    Stress fracture of navicular bone of left foot 10/23/2018   Type 2 diabetes mellitus without complication, without long-term current use of insulin (HCC) 08/23/2017   Type II diabetes mellitus (HCC)    doesn't have    Past Surgical History:  Procedure Laterality Date   ARTHRODESIS     CARDIAC CATHETERIZATION     CESAREAN SECTION     CHOLECYSTECTOMY     CHOLECYSTECTOMY, LAPAROSCOPIC     COLONOSCOPY  09/17/2014   Colonic polyps status postr polypectomy. Mild sigmoid diverticulosis. Limited examination of the right side of the colon due to the quality of preparation.    CORONARY ANGIOPLASTY     ESOPHAGOGASTRODUODENOSCOPY  07/17/2013   Small hiatal hernia. Healed esophageal erosions (biopsied)  FOOT SURGERY Left 2017   and in 2018 had to restructue it again   HERNIA REPAIR  2012   HYSTEROSCOPY     REPAIR NONUNION / MALUNION METATARSAL / TARSAL BONES     REPLACEMENT TOTAL KNEE  2016   SHOULDER ARTHROSCOPY      Current Medications: Current Meds  Medication Sig   albuterol (VENTOLIN HFA) 108 (90 Base) MCG/ACT inhaler Inhale 2 puffs into the lungs every 6 (six) hours as needed for wheezing or shortness of breath.   AMBULATORY NON FORMULARY MEDICATION Take 30 drops by mouth 2 (two) times daily. CBD oil   atorvastatin (LIPITOR) 40 MG tablet Take 1  tablet (40 mg total) by mouth daily.   busPIRone (BUSPAR) 10 MG tablet Take 10 mg by mouth 2 (two) times daily.   cloNIDine (CATAPRES) 0.3 MG tablet Take 0.3 mg by mouth at bedtime.   clopidogrel (PLAVIX) 75 MG tablet Take 1 tablet (75 mg total) by mouth daily.   cyclobenzaprine (FLEXERIL) 10 MG tablet Take 1 tablet by mouth 2 (two) times daily.    esomeprazole (NEXIUM) 40 MG capsule Take 40 mg by mouth 2 (two) times daily as needed.   ezetimibe (ZETIA) 10 MG tablet TAKE ONE TABLET (10mg  total) BY MOUTH DAILY (Patient taking differently: Take 10 mg by mouth daily.)   famotidine (PEPCID) 40 MG tablet Take 1 tablet by mouth at bedtime.   fluticasone (FLONASE) 50 MCG/ACT nasal spray Place 2 sprays into both nostrils 2 (two) times daily.   furosemide (LASIX) 40 MG tablet Take 40 mg by mouth daily as needed.   hydrALAZINE (APRESOLINE) 25 MG tablet Take 25 mg by mouth 3 (three) times daily.   meclizine (ANTIVERT) 25 MG tablet Take 25-50 mg by mouth 2 (two) times daily as needed for dizziness.   MOUNJARO 2.5 MG/0.5ML Pen Inject 2.5 mg into the skin once a week.   MOUNJARO 5 MG/0.5ML Pen Inject 5 mg into the skin once a week.   MOUNJARO 7.5 MG/0.5ML Pen Inject 7.5 mg into the skin once a week.   NAPROXEN PO Take 2 tablets by mouth as needed (pain). Unknown strength   nitroGLYCERIN (NITROSTAT) 0.4 MG SL tablet Place 1 tablet (0.4 mg total) under the tongue as needed for chest pain.   olmesartan (BENICAR) 40 MG tablet Take 40 mg by mouth daily.   olopatadine (PATANOL) 0.1 % ophthalmic solution Place 2 drops into both eyes as needed for allergies.   OZEMPIC, 0.25 OR 0.5 MG/DOSE, 2 MG/1.5ML SOPN Inject 0.25 mg into the skin daily.   polyethylene glycol powder (GLYCOLAX/MIRALAX) 17 GM/SCOOP powder Take 1 g by mouth daily.   Vitamin D, Ergocalciferol, (DRISDOL) 1.25 MG (50000 UNIT) CAPS capsule Take 50,000 Units by mouth once a week.     Allergies:   Azithromycin, Celecoxib, Morphine, Duloxetine,  Erythromycin base, and Lisinopril   Social History   Socioeconomic History   Marital status: Married    Spouse name: Not on file   Number of children: 2   Years of education: Not on file   Highest education level: Not on file  Occupational History   Occupation: Retired   Tobacco Use   Smoking status: Former    Current packs/day: 0.00    Types: Cigarettes    Quit date: 1989    Years since quitting: 36.2   Smokeless tobacco: Never  Vaping Use   Vaping status: Some Days  Substance and Sexual Activity   Alcohol use: Yes  Alcohol/week: 2.0 standard drinks of alcohol    Types: 2 Glasses of wine per week   Drug use: Never   Sexual activity: Not on file  Other Topics Concern   Not on file  Social History Narrative   Not on file   Social Drivers of Health   Financial Resource Strain: Medium Risk (10/16/2021)   Received from [facility], [facility]   Overall Financial Resource Strain (CARDIA)    Difficulty of Paying Living Expenses: Somewhat hard  Food Insecurity: No Food Insecurity (02/15/2022)   Received from [facility], [facility]   Hunger Vital Sign    Worried About Running Out of Food in the Last Year: Never true    Ran Out of Food in the Last Year: Never true  Transportation Needs: No Transportation Needs (10/16/2021)   Received from [facility], [facility]   PRAPARE - Transportation    Lack of Transportation (Medical): No    Lack of Transportation (Non-Medical): No  Physical Activity: Insufficiently Active (10/16/2021)   Received from [facility], [facility]   Exercise Vital Sign    Days of Exercise per Week: 1 day    Minutes of Exercise per Session: 10 min  Stress: No Stress Concern Present (10/16/2021)   Received from [facility], [facility] - Occupational Stress Questionnaire    Feeling of Stress : Only a little  Social Connections: Unknown (03/27/2022)   Received from [facility], [facility]   Social Network    Social Network: Not on file     Family History: The patient's family history includes Arthritis in her daughter; CAD in her mother; Colon cancer in her sister; Heart attack in her father; Heart disease in her mother; Hypertension in her father and mother. There is no history of Breast cancer, Esophageal cancer, Stomach cancer, or Rectal cancer. ROS:   Please see the history of present illness.    All 14 point review of systems negative except as described per history of present illness  EKGs/Labs/Other Studies Reviewed:    EKG Interpretation Date/Time:  Tuesday March 05 2024 13:57:33 EDT Ventricular Rate:  57 PR Interval:  146 QRS Duration:  82 QT Interval:  450 QTC Calculation: 438 R Axis:   67  Text Interpretation: Sinus bradycardia Nonspecific T wave abnormality No previous ECGs available Confirmed by Gypsy Balsam [phone]) on 03/05/2024 2:14:20 PM    Recent Labs: No results found for requested labs within last 365 days.  Recent Lipid Panel    Component Value Date/Time   CHOL 136 04/20/2022 1526   TRIG 77 04/20/2022 1526   HDL 54 04/20/2022 1526   CHOLHDL 2.5 04/20/2022 1526   LDLCALC 67 04/20/2022 1526    Physical Exam:    VS:  BP (!) 150/82 (BP Location: Right Arm, Patient Position: Sitting)   Pulse 95   Ht 5\' 6"  (1.676 m)   Wt 240 lb (108.9 kg)   SpO2 99%   BMI 38.74 kg/m     Wt Readings from Last 3 Encounters:  03/05/24 240 lb (108.9 kg)  04/12/23 234 lb (106.1 kg)  03/29/23 234 lb 12.8 oz (106.5 kg)     GEN:  Well nourished, well developed in no acute distress HEENT: Normal NECK: No JVD; No carotid bruits LYMPHATICS: No lymphadenopathy CARDIAC: RRR, no murmurs, no rubs, no gallops RESPIRATORY:  Clear to auscultation without rales, wheezing or rhonchi  ABDOMEN: Soft, non-tender, non-distended MUSCULOSKELETAL:  No edema; No  deformity  SKIN: Warm and dry LOWER EXTREMITIES: no swelling NEUROLOGIC:  Alert and oriented x  3 PSYCHIATRIC:  Normal affect   ASSESSMENT:    1. Atypical chest pain   2. Primary hypertension   3. Coronary artery disease involving native coronary artery of native heart without angina pectoris   4. OSA (obstructive sleep apnea)   5. Dyslipidemia    PLAN:    In order of problems listed above:  Coronary disease: Stable denies have any chest pain.  Will continue guideline directed medical therapy. Dyslipidemia I did review K PN which show me LDL 68 HDL 64 this is from December 12, 2023 continue present management. Obstructive sleep apnea that is followed by internal medicine team. Blood pressure being elevated but she checked it at home 120/70 will continue present management She wanted me to stop some of the medication we spent a great of time talking about need to take this medication I will listed and reviewed this medication one by one   Medication Adjustments/Labs and Tests Ordered: Current medicines are reviewed at length with the patient today.  Concerns regarding medicines are outlined above.  Orders Placed This Encounter  Procedures   EKG 12-Lead   Medication changes: No orders of the defined types were placed in this encounter.   Signed, Georgeanna Lea, MD,  [facility] 03/05/2024 2:24 PM    [facility]

## 2024-03-05 NOTE — Patient Instructions (Signed)

## 2024-05-03 DIAGNOSIS — N1832 Chronic kidney disease, stage 3b: Secondary | ICD-10-CM | POA: Diagnosis not present

## 2024-05-03 DIAGNOSIS — I129 Hypertensive chronic kidney disease with stage 1 through stage 4 chronic kidney disease, or unspecified chronic kidney disease: Secondary | ICD-10-CM | POA: Diagnosis not present

## 2024-05-03 DIAGNOSIS — E1159 Type 2 diabetes mellitus with other circulatory complications: Secondary | ICD-10-CM | POA: Diagnosis not present

## 2024-05-03 DIAGNOSIS — I2510 Atherosclerotic heart disease of native coronary artery without angina pectoris: Secondary | ICD-10-CM | POA: Diagnosis not present

## 2024-05-14 DIAGNOSIS — E559 Vitamin D deficiency, unspecified: Secondary | ICD-10-CM | POA: Diagnosis not present

## 2024-05-14 DIAGNOSIS — I129 Hypertensive chronic kidney disease with stage 1 through stage 4 chronic kidney disease, or unspecified chronic kidney disease: Secondary | ICD-10-CM | POA: Diagnosis not present

## 2024-05-14 DIAGNOSIS — K219 Gastro-esophageal reflux disease without esophagitis: Secondary | ICD-10-CM | POA: Diagnosis not present

## 2024-05-14 DIAGNOSIS — I2510 Atherosclerotic heart disease of native coronary artery without angina pectoris: Secondary | ICD-10-CM | POA: Diagnosis not present

## 2024-05-23 ENCOUNTER — Other Ambulatory Visit: Payer: Self-pay | Admitting: Cardiology

## 2024-07-01 ENCOUNTER — Other Ambulatory Visit: Payer: Self-pay | Admitting: Cardiology

## 2024-07-18 ENCOUNTER — Other Ambulatory Visit: Payer: Self-pay | Admitting: Cardiology

## 2024-09-10 DIAGNOSIS — R946 Abnormal results of thyroid function studies: Secondary | ICD-10-CM | POA: Diagnosis not present

## 2024-09-10 DIAGNOSIS — E559 Vitamin D deficiency, unspecified: Secondary | ICD-10-CM | POA: Diagnosis not present

## 2024-09-10 DIAGNOSIS — I2510 Atherosclerotic heart disease of native coronary artery without angina pectoris: Secondary | ICD-10-CM | POA: Diagnosis not present

## 2024-09-10 DIAGNOSIS — K219 Gastro-esophageal reflux disease without esophagitis: Secondary | ICD-10-CM | POA: Diagnosis not present

## 2024-09-10 DIAGNOSIS — E1159 Type 2 diabetes mellitus with other circulatory complications: Secondary | ICD-10-CM | POA: Diagnosis not present

## 2024-09-10 DIAGNOSIS — I129 Hypertensive chronic kidney disease with stage 1 through stage 4 chronic kidney disease, or unspecified chronic kidney disease: Secondary | ICD-10-CM | POA: Diagnosis not present

## 2024-09-17 DIAGNOSIS — Z0000 Encounter for general adult medical examination without abnormal findings: Secondary | ICD-10-CM | POA: Diagnosis not present

## 2024-09-17 DIAGNOSIS — Z23 Encounter for immunization: Secondary | ICD-10-CM | POA: Diagnosis not present

## 2024-09-17 DIAGNOSIS — K219 Gastro-esophageal reflux disease without esophagitis: Secondary | ICD-10-CM | POA: Diagnosis not present

## 2024-09-17 DIAGNOSIS — E559 Vitamin D deficiency, unspecified: Secondary | ICD-10-CM | POA: Diagnosis not present

## 2024-09-17 DIAGNOSIS — E1159 Type 2 diabetes mellitus with other circulatory complications: Secondary | ICD-10-CM | POA: Diagnosis not present

## 2024-09-17 DIAGNOSIS — I2510 Atherosclerotic heart disease of native coronary artery without angina pectoris: Secondary | ICD-10-CM | POA: Diagnosis not present
